# Patient Record
Sex: Male | Born: 1975 | Race: White | Hispanic: No | Smoking: Never smoker
Health system: Southern US, Academic
[De-identification: ages and names within clinical notes are randomized; demographics above are authoritative.]

## PROBLEM LIST (undated history)

## (undated) DIAGNOSIS — F419 Anxiety disorder, unspecified: Secondary | ICD-10-CM

## (undated) DIAGNOSIS — J309 Allergic rhinitis, unspecified: Secondary | ICD-10-CM

## (undated) DIAGNOSIS — H9209 Otalgia, unspecified ear: Secondary | ICD-10-CM

## (undated) DIAGNOSIS — I10 Essential (primary) hypertension: Secondary | ICD-10-CM

## (undated) HISTORY — DX: Anxiety disorder, unspecified: F41.9

## (undated) HISTORY — DX: Allergic rhinitis, unspecified: J30.9

## (undated) HISTORY — PX: HX NO SURGICAL PROCEDURES: 2100001501

## (undated) HISTORY — DX: Essential (primary) hypertension: I10

## (undated) HISTORY — DX: Otalgia, unspecified ear: H92.09

## (undated) HISTORY — PX: OTHER SURGICAL HISTORY: SHX170

---

## 2001-09-12 ENCOUNTER — Other Ambulatory Visit (HOSPITAL_COMMUNITY): Payer: Self-pay

## 2015-01-15 IMAGING — CR XRAY LUMBAR SPINE COMPLETE
1 series · 4 of 4 positions shown · non-contrast
Comparison: None.

Exam:   
Lumbar spine 4V
INDICATION: Back pain.

[Series 1: view not recorded · oblique · 0.17mm/px · 4 of 4 slices shown]
[im 1/4]
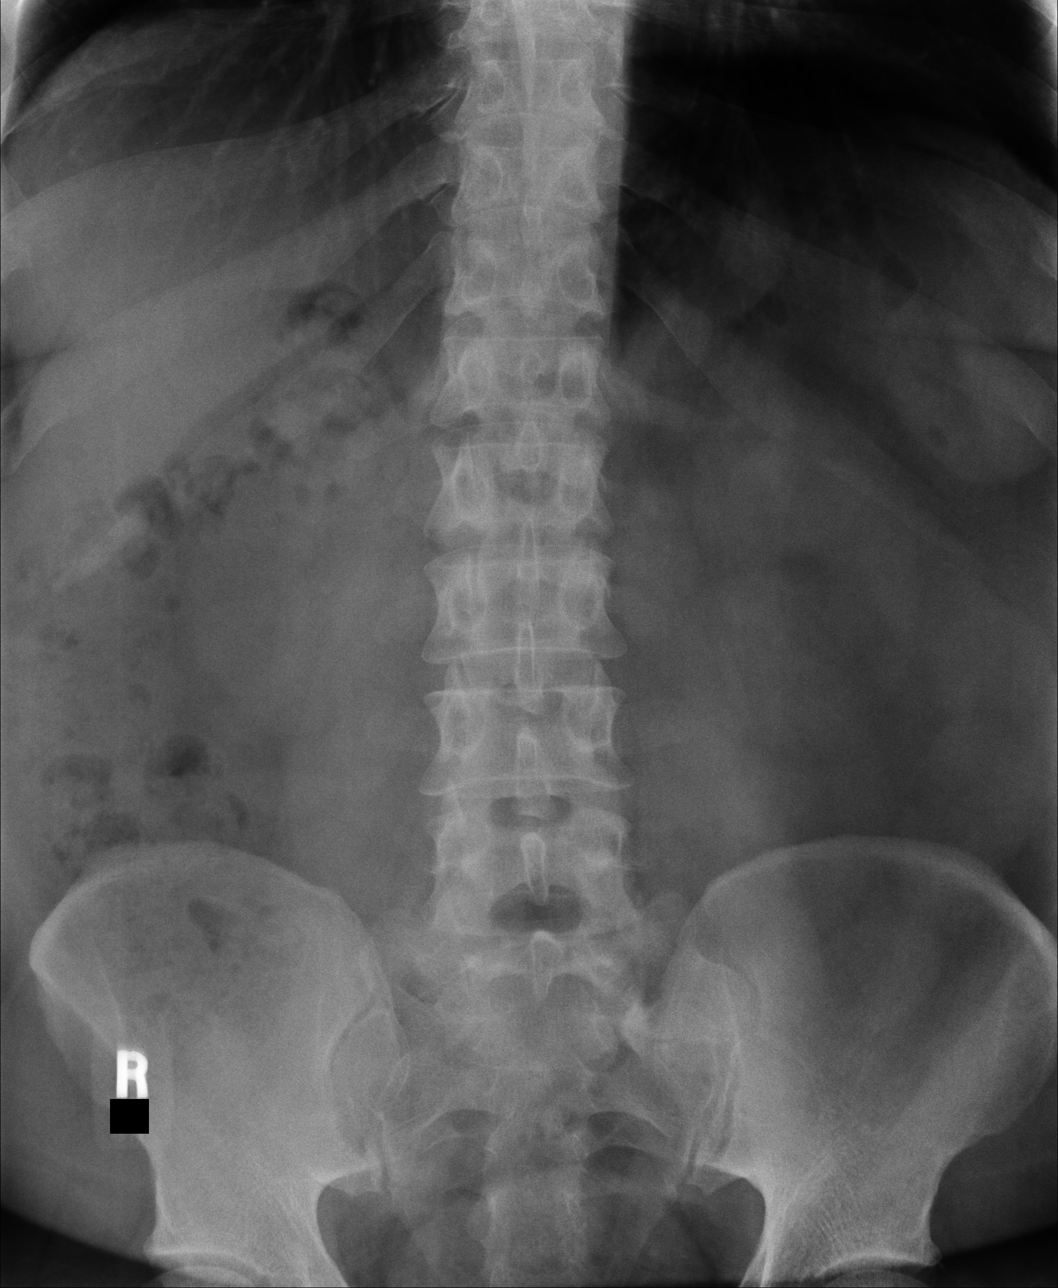
[im 2/4]
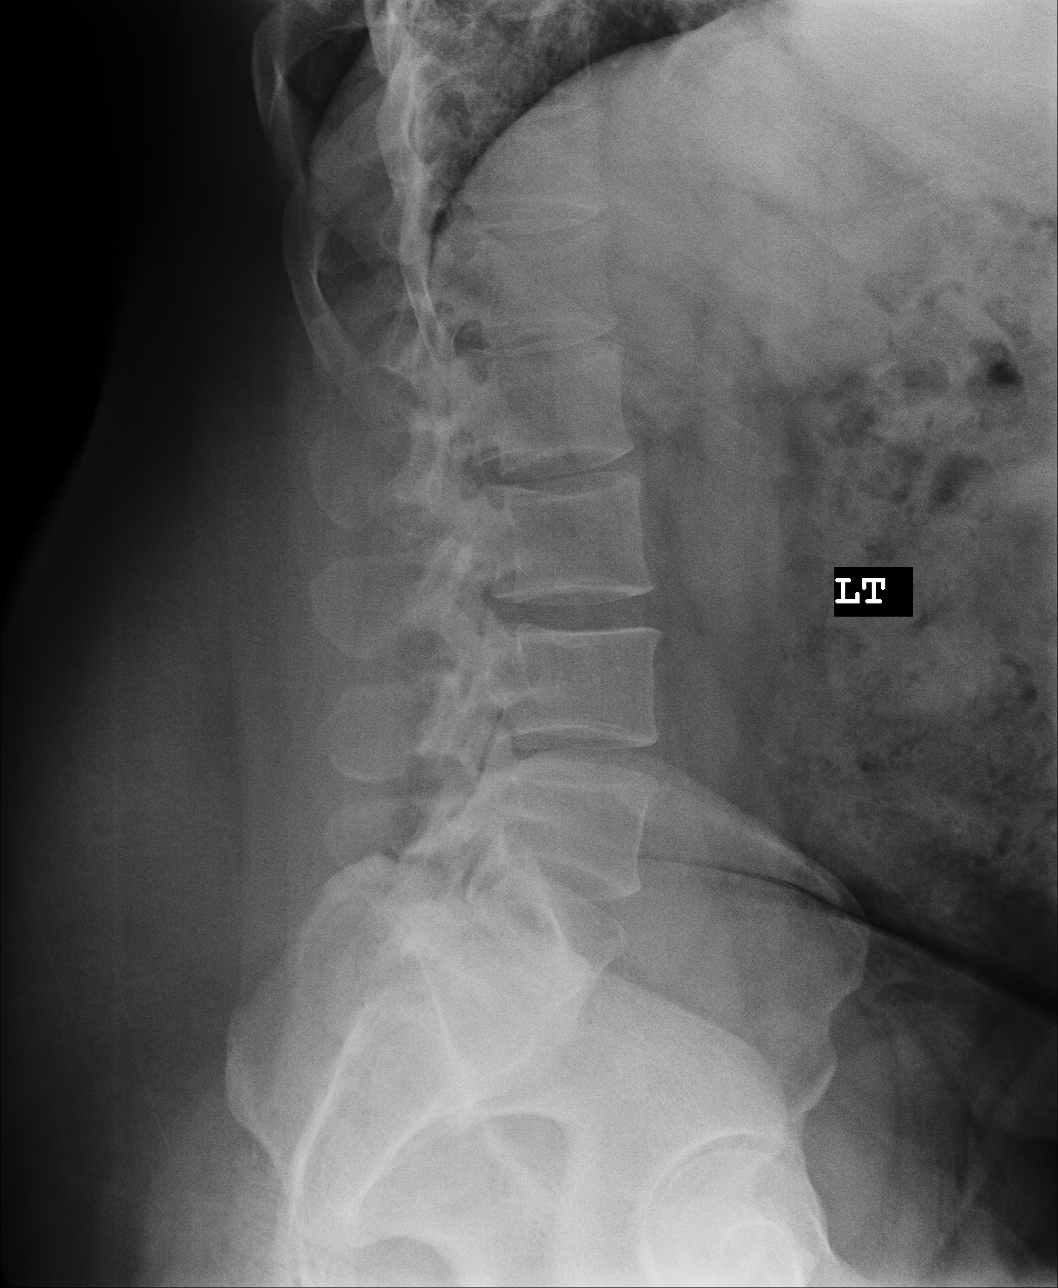
[im 3/4]
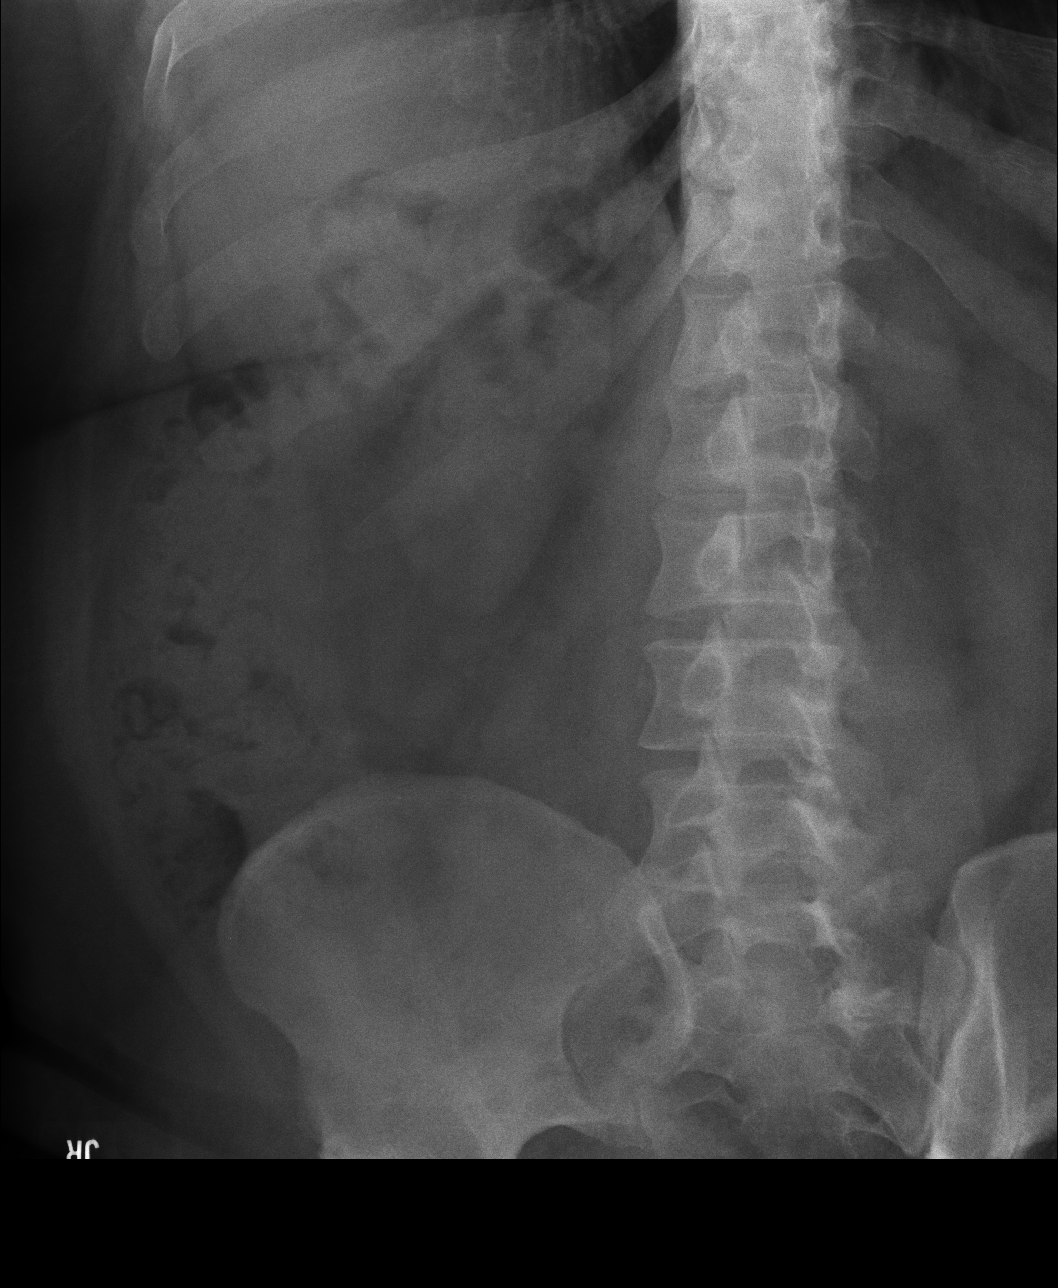
[im 4/4]
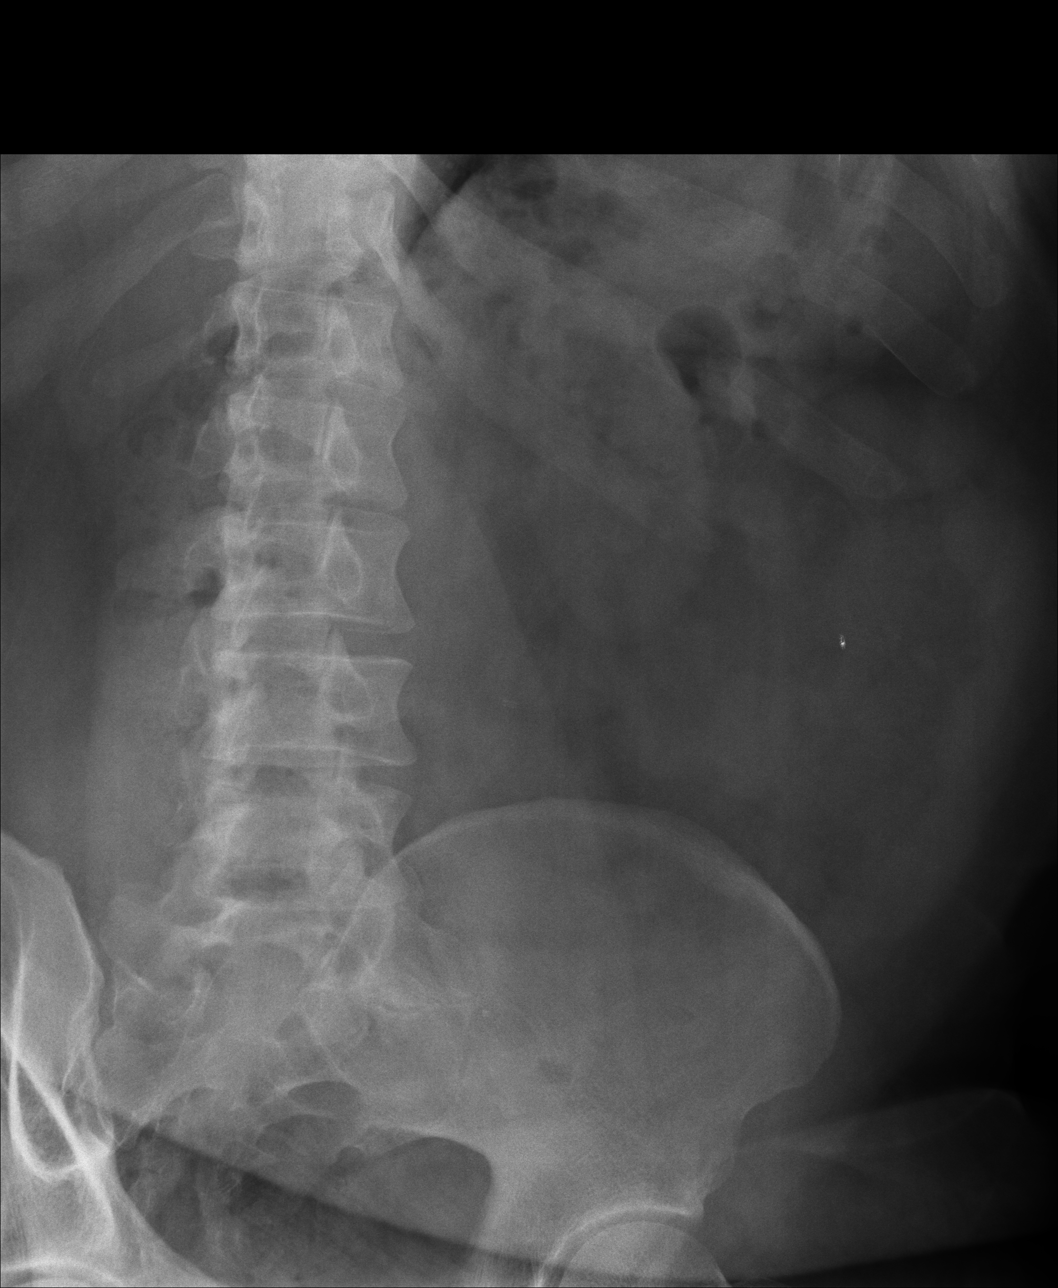

[4 of 4 positions shown; findings below may reference images not displayed]

FINDINGS: Lateral exam demonstrates normal height and alignment of the lumbar vertebral bodies. The disk spaces appear preserved. No pars defects are seen. No fractures are seen.
IMPRESSION: 1.
Negative lumbar spine.

## 2015-01-15 IMAGING — US VENOUS US UNILATERAL
1 series · 14 of 24 positions shown · non-contrast
Comparison: None.

Exam:   
Right lower extremity Doppler venous ultrasound
INDICATION: Rule out DVT.

[Series 1: venous us unilateral · oblique · 14 of 62 slices shown]
[im 1/62]
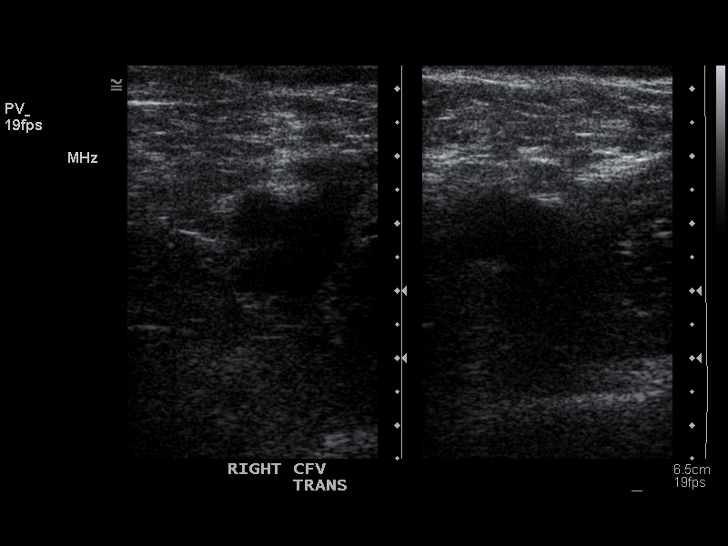
[im 6/62]
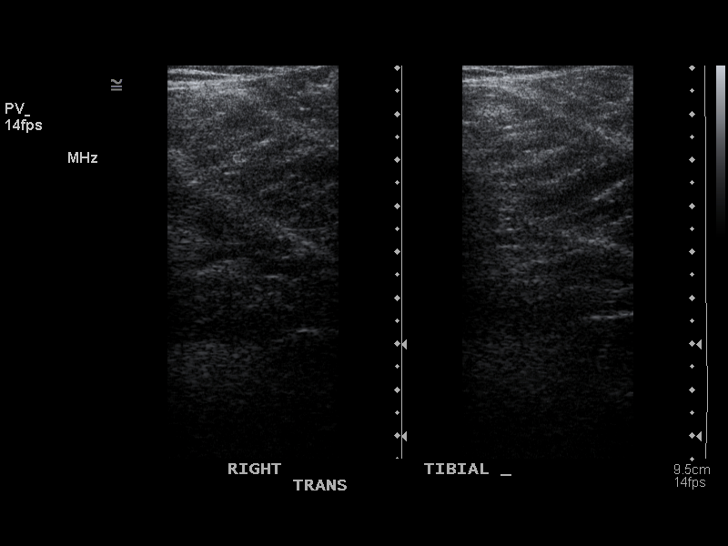
[im 11/62]
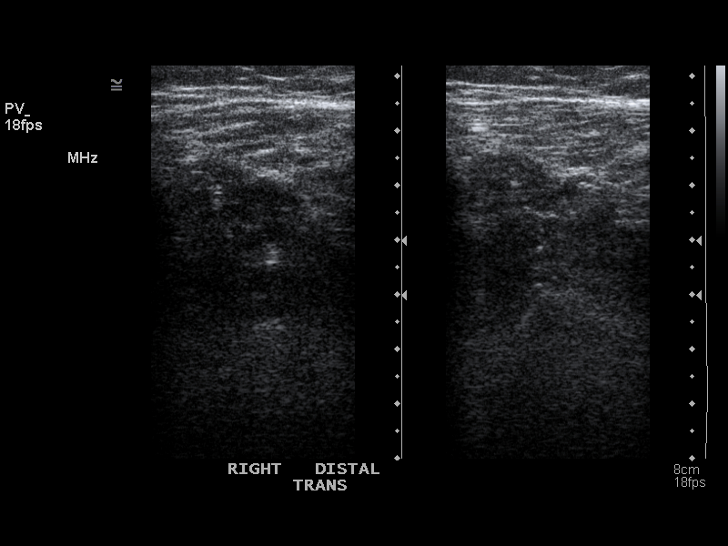
[im 16/62]
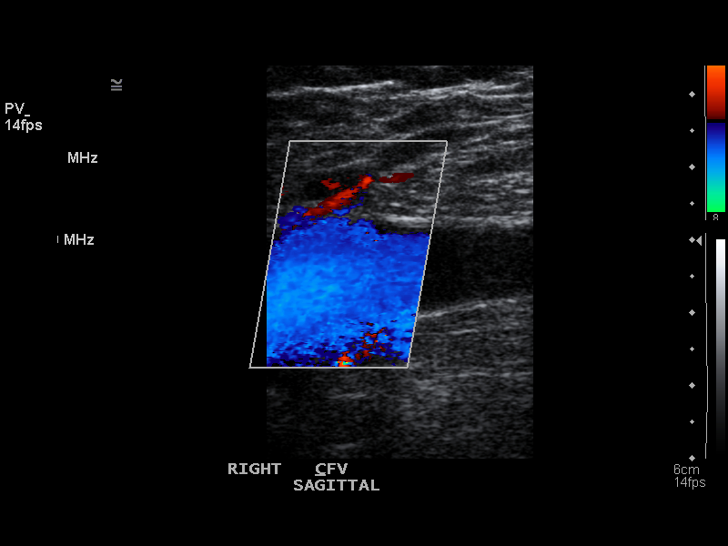
[im 19/62]
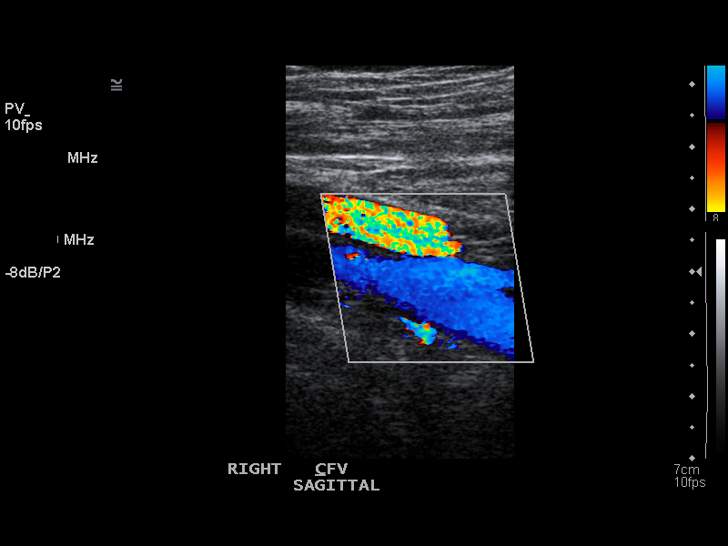
[im 24/62]
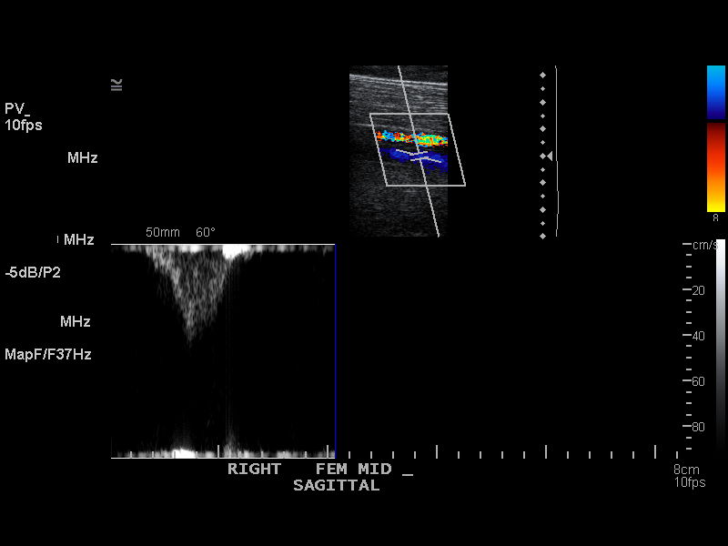
[im 30/62]
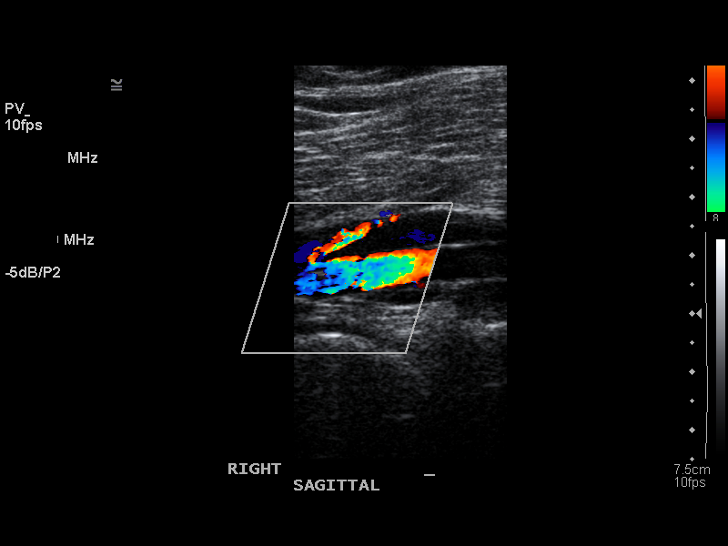
[im 32/62]
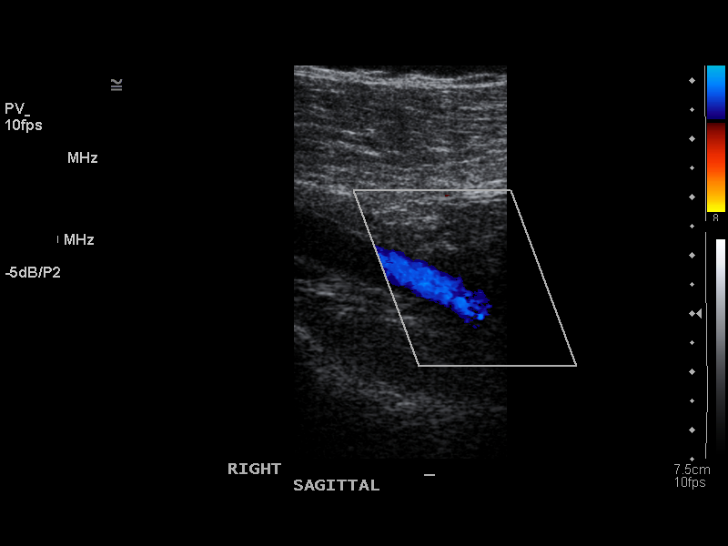
[im 38/62]
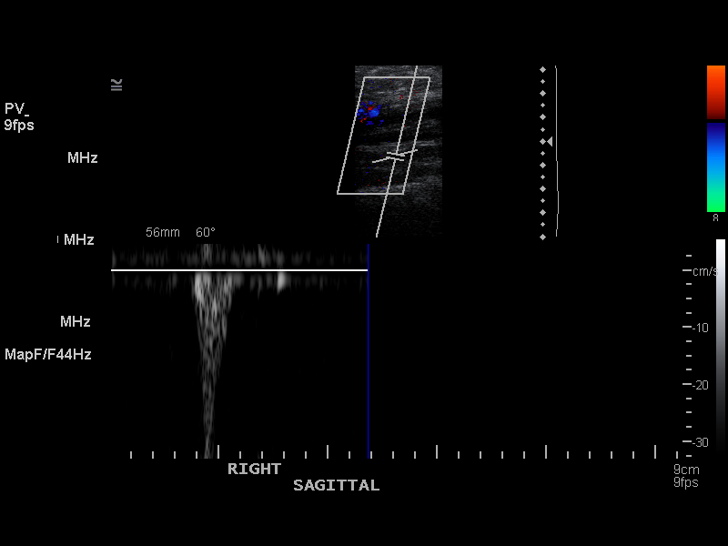
[im 43/62]
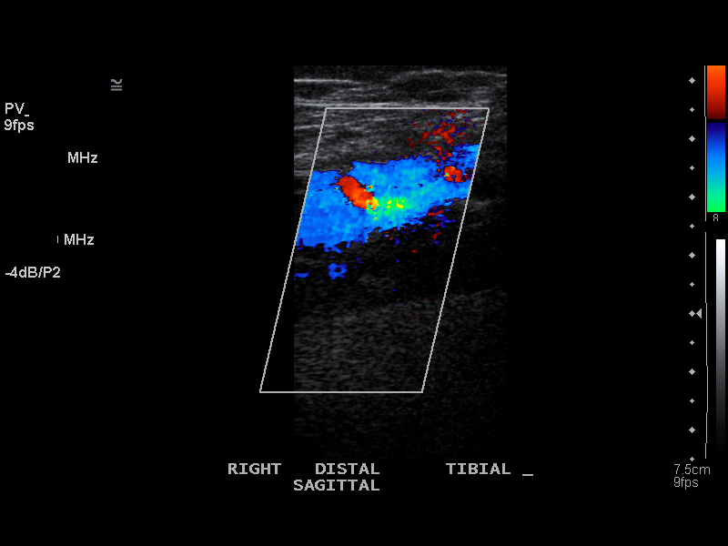
[im 48/62]
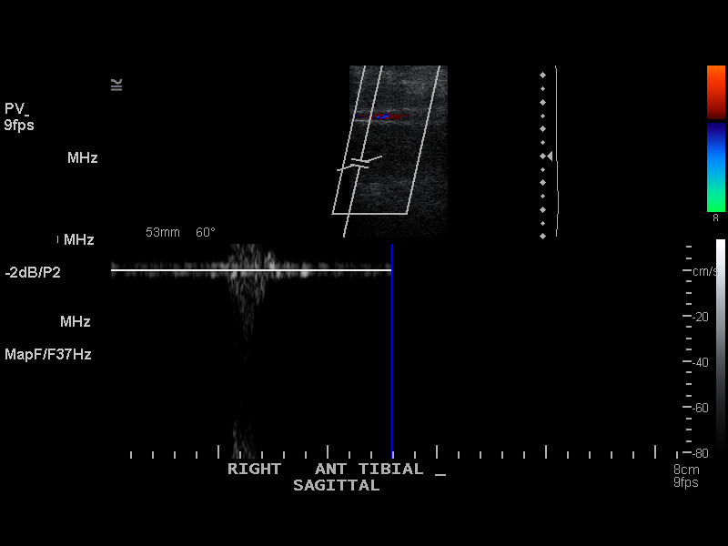
[im 51/62]
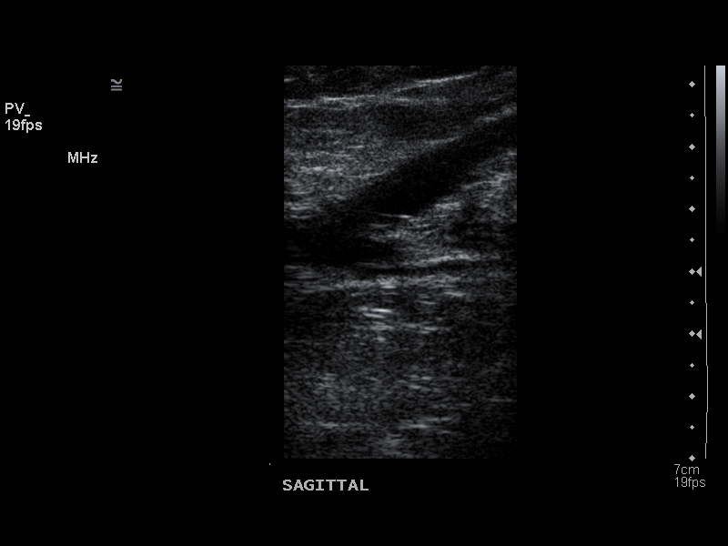
[im 56/62]
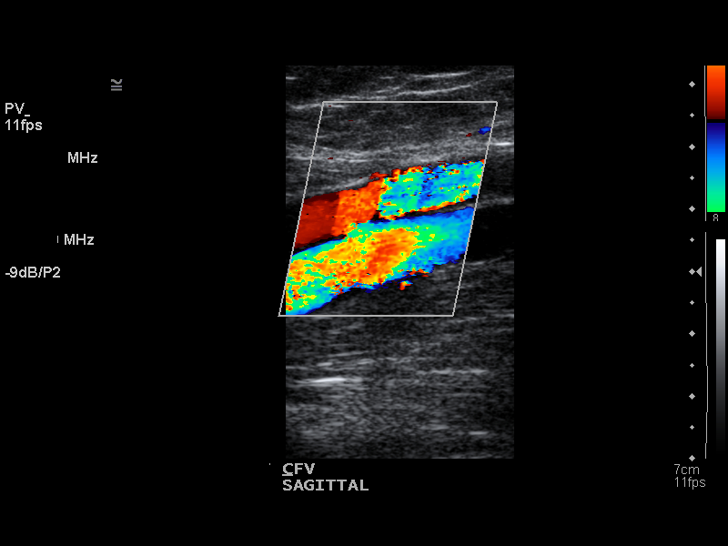
[im 62/62]
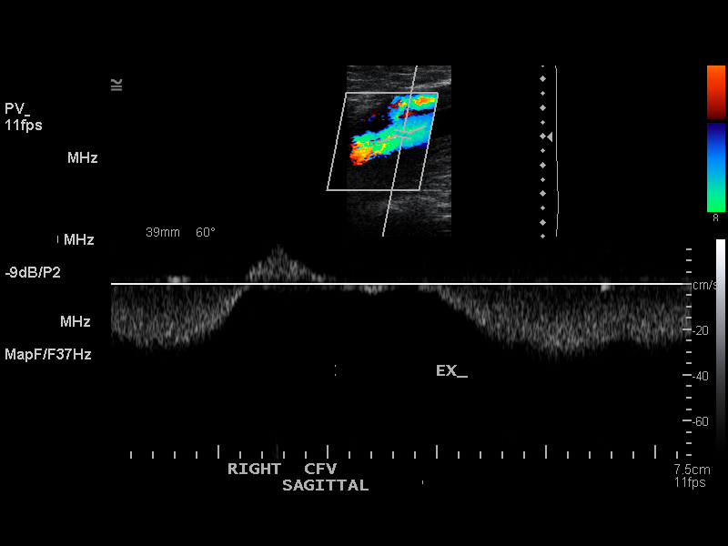

[14 of 24 positions shown; findings below may reference images not displayed]

FINDINGS: The common femoral, superficial femoral, popliteal veins of the right lower extremity demonstrate compressibility, color flow, and augmentation. The contra lateral common femoral vein demonstrates compressibility as well.
IMPRESSION: 1.
Negative right lower extremity Doppler venous ultrasound.

## 2015-01-15 IMAGING — CR XRAY FEMUR 2 OR MORE VIEWS
1 series · 4 of 4 positions shown · non-contrast
Comparison: None.

Exam:   
Right femur 2V
INDICATION: Pain.

[Series 1: view not recorded · oblique · 0.17mm/px · 4 of 4 slices shown]
[im 1/4]
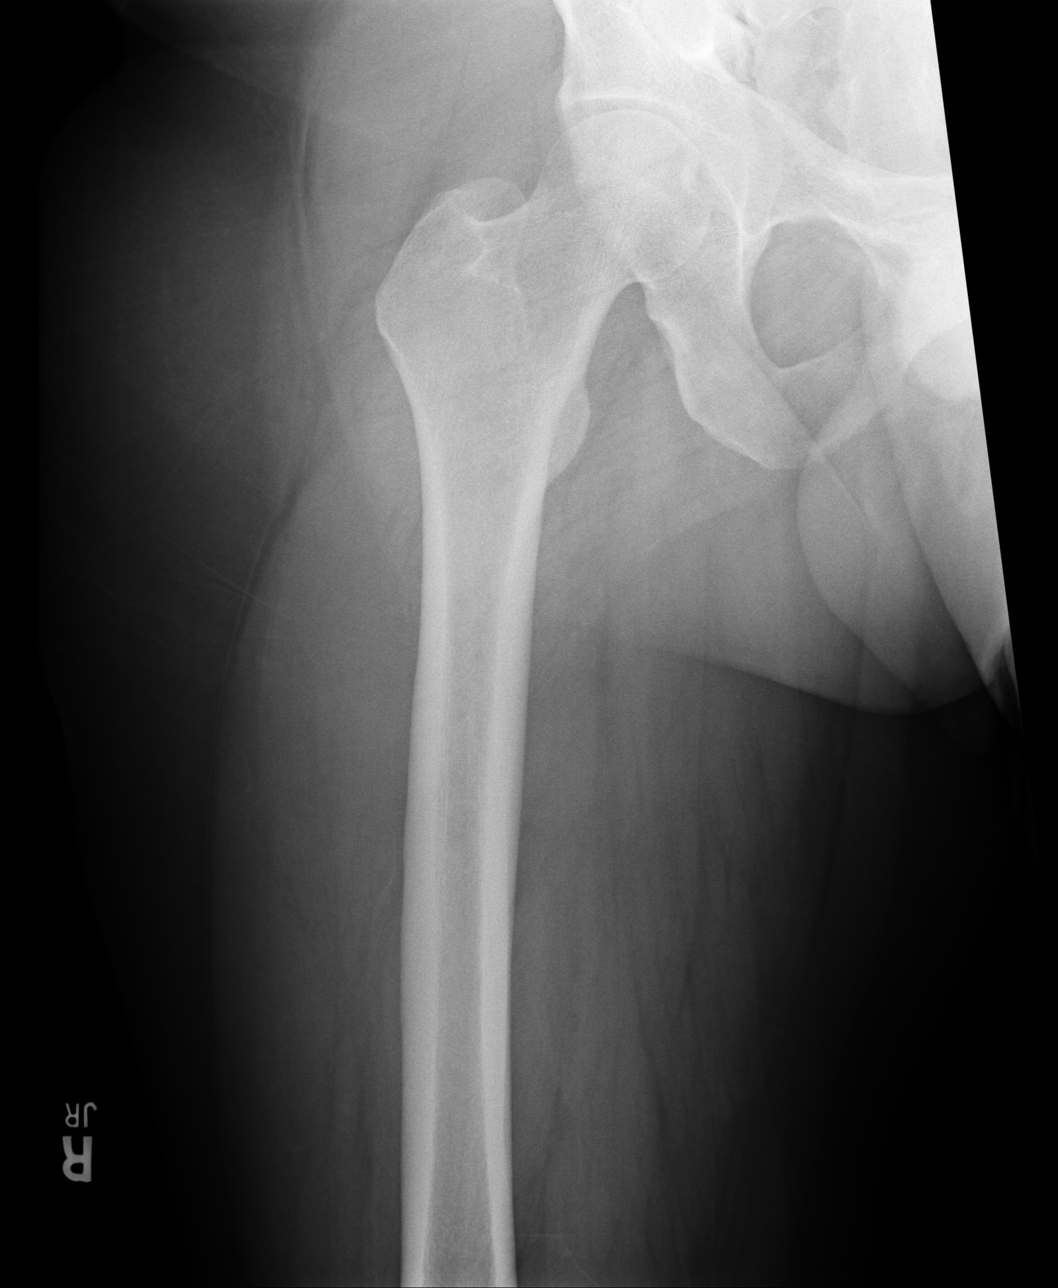
[im 2/4]
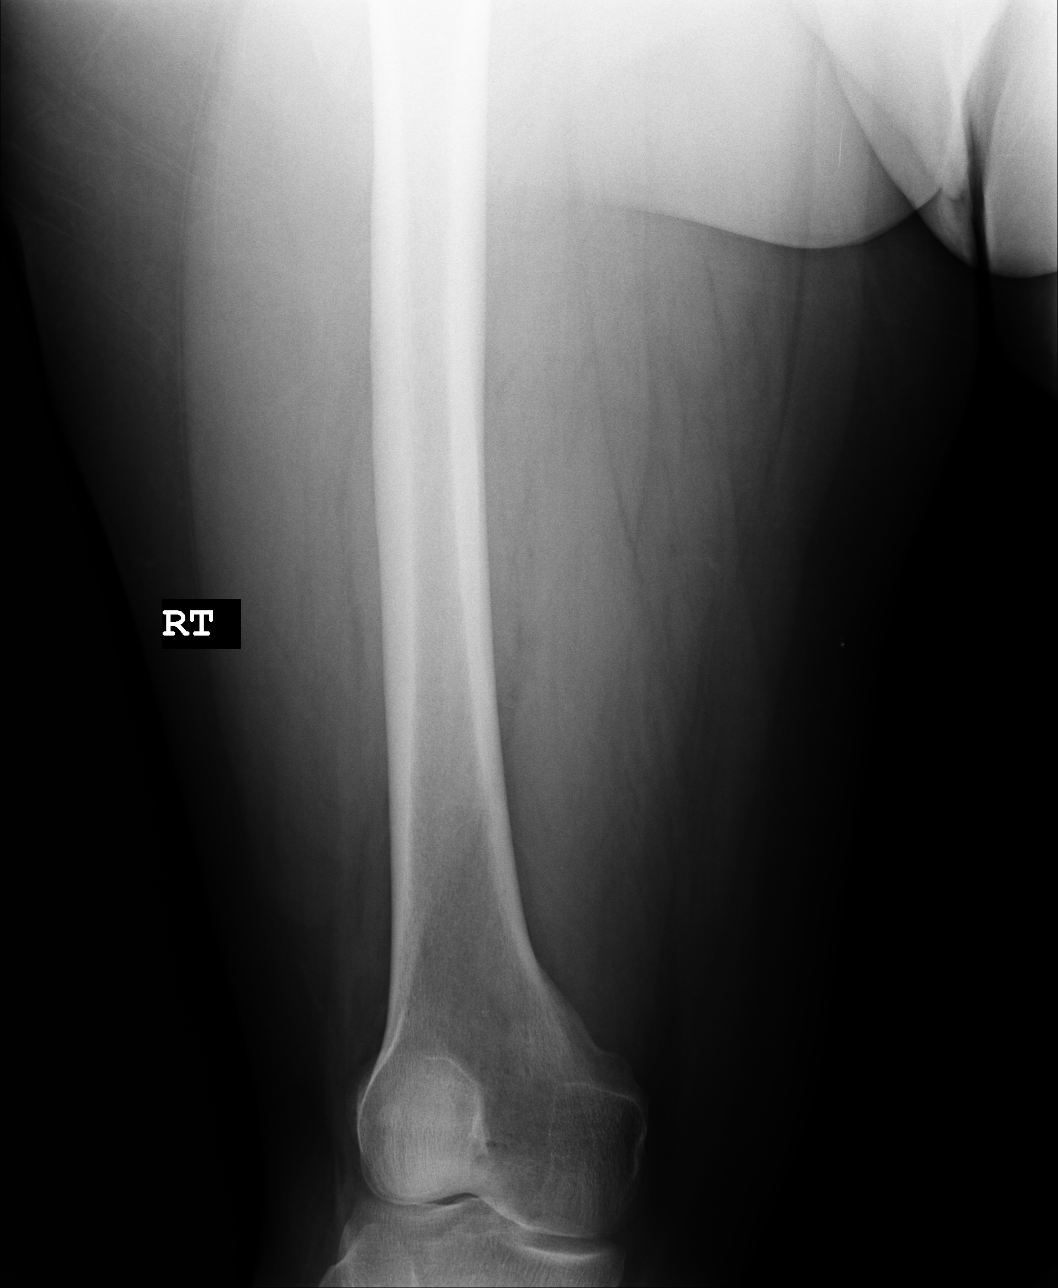
[im 3/4]
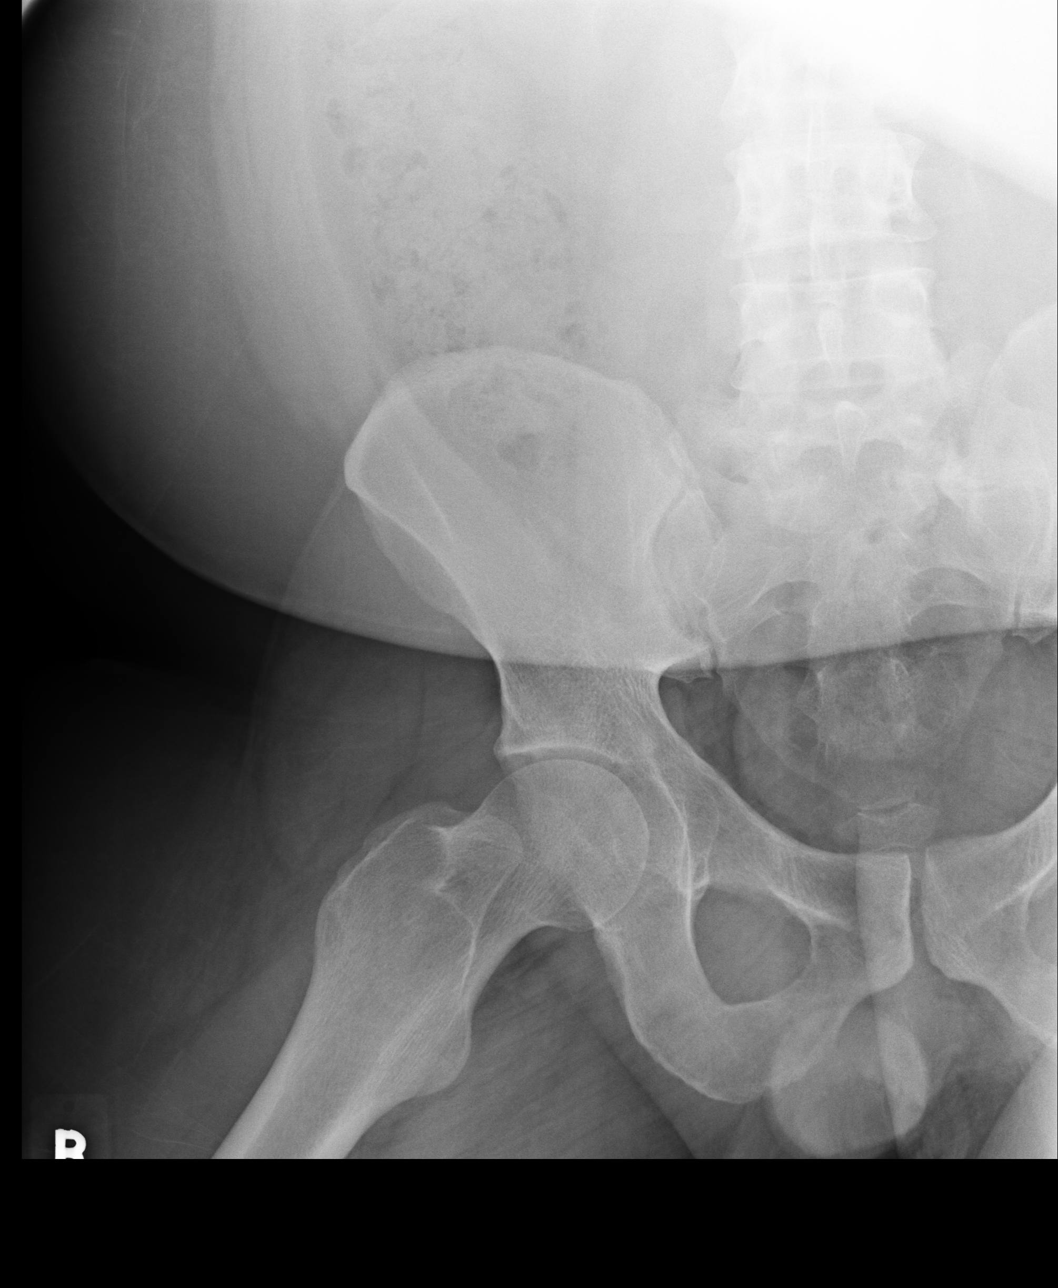
[im 4/4]
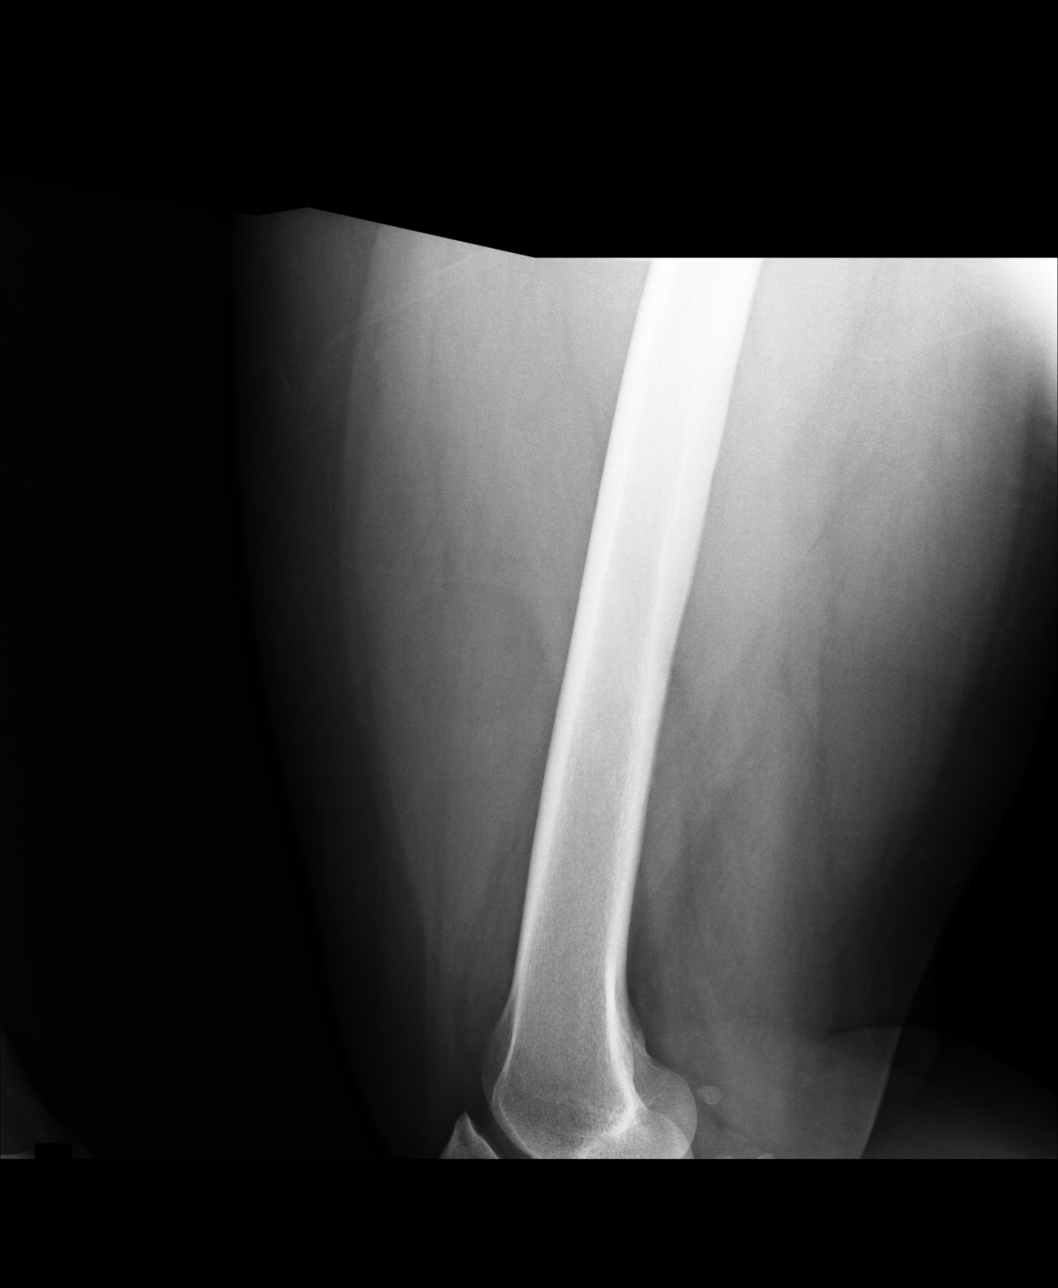

[4 of 4 positions shown; findings below may reference images not displayed]

FINDINGS: The joint spaces are preserved. No fractures are seen.
IMPRESSION: 1.
Negative right femur.

## 2019-12-15 IMAGING — US US ABDOMEN COMPLETE
1 series · 14 of 25 positions shown · non-contrast
Comparison: None.

EXAM:  US ABDOMEN COMPLETE
INDICATION: Right upper quadrant abdominal pain.

[Series 5: us abdomen complete · 0.43mm/px · 14 of 49 slices shown]
[im 1/49]
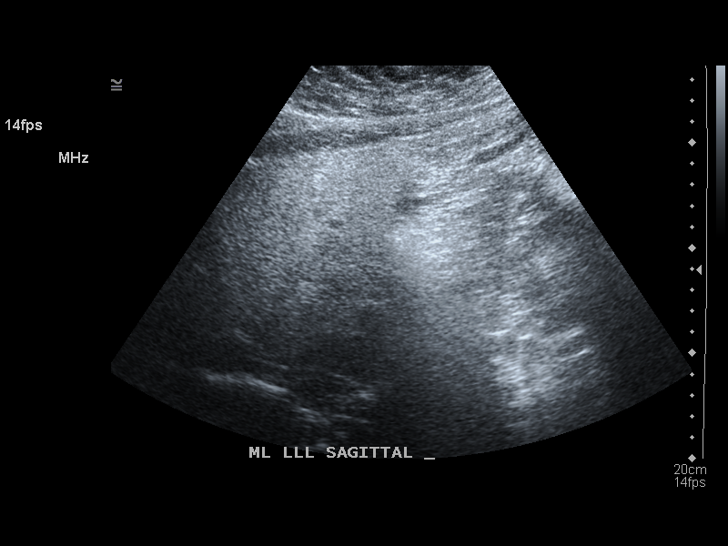
[im 5/49]
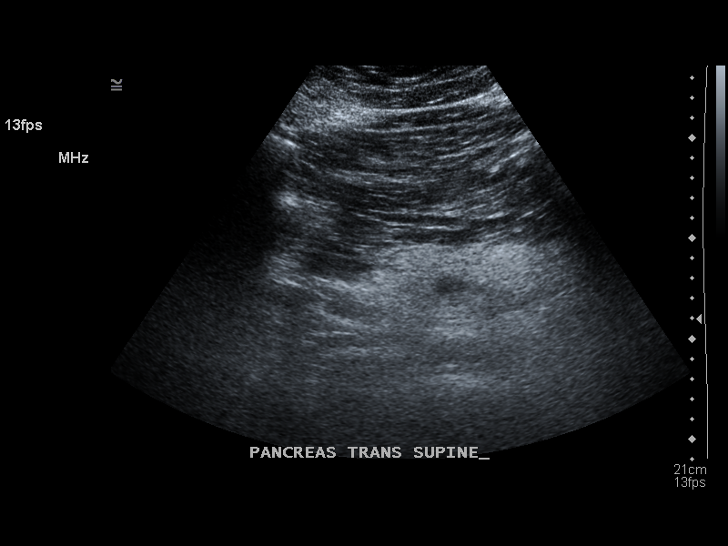
[im 9/49]
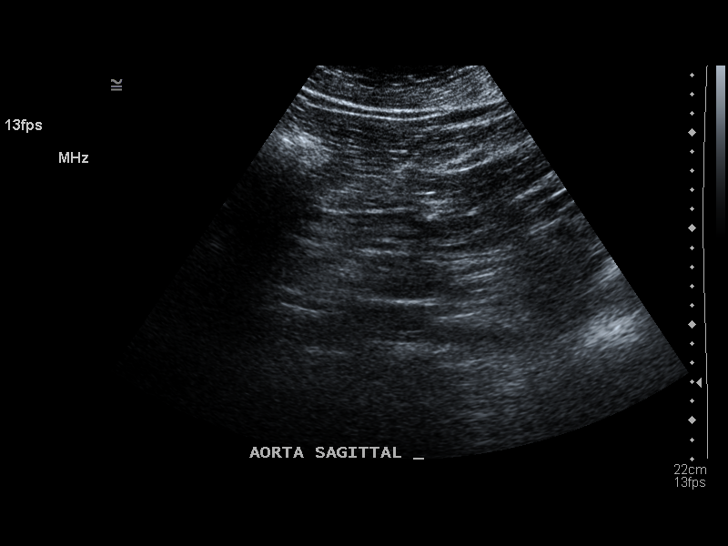
[im 13/49]
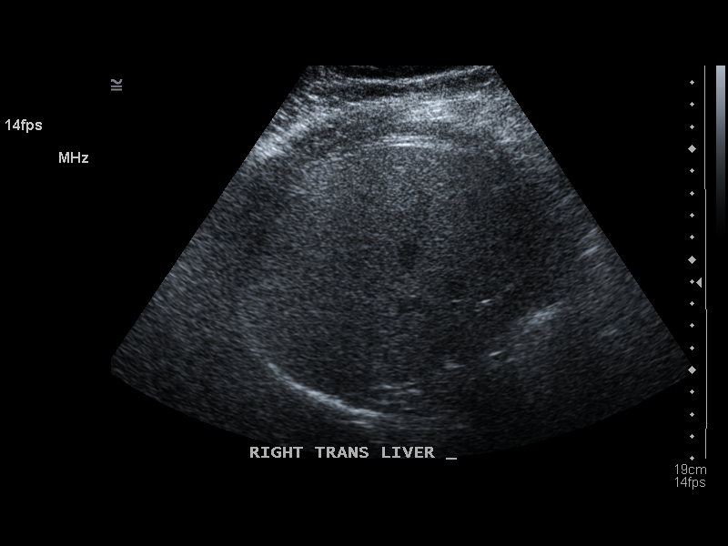
[im 17/49]
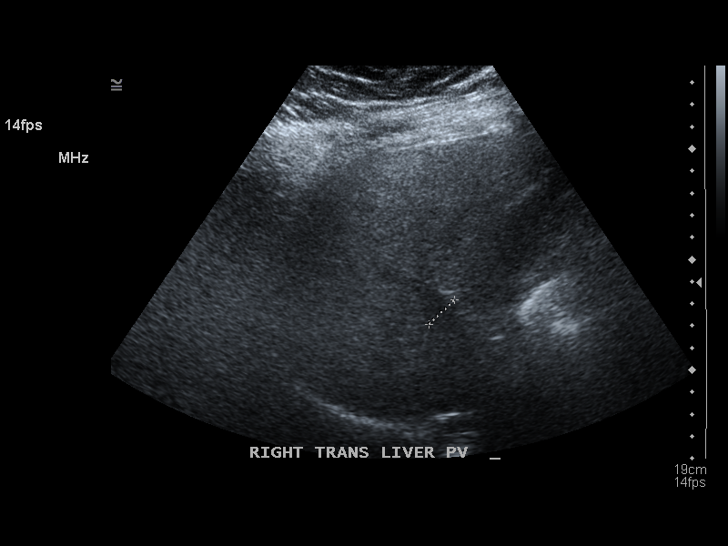
[im 19/49]
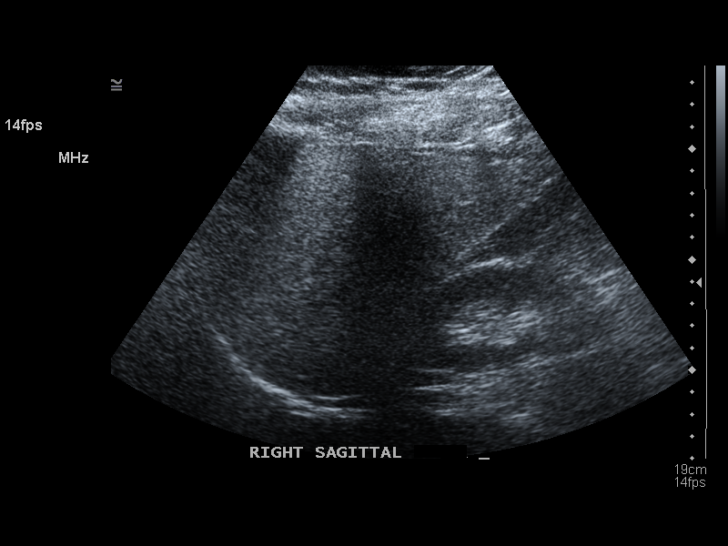
[im 23/49]
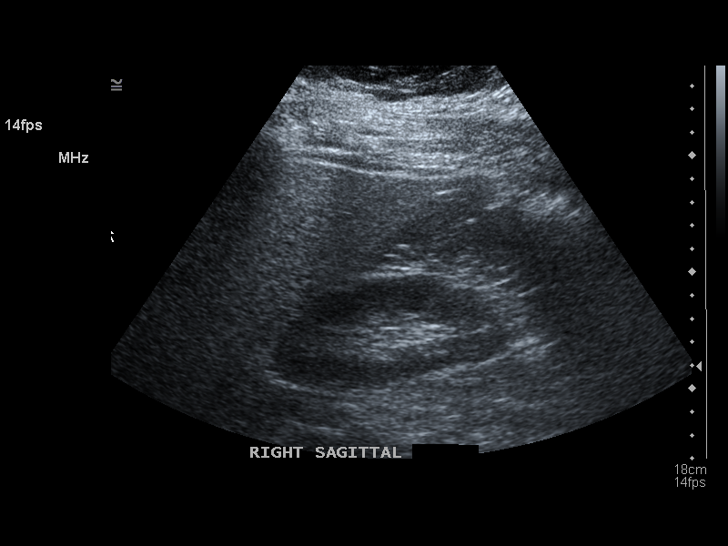
[im 27/49]
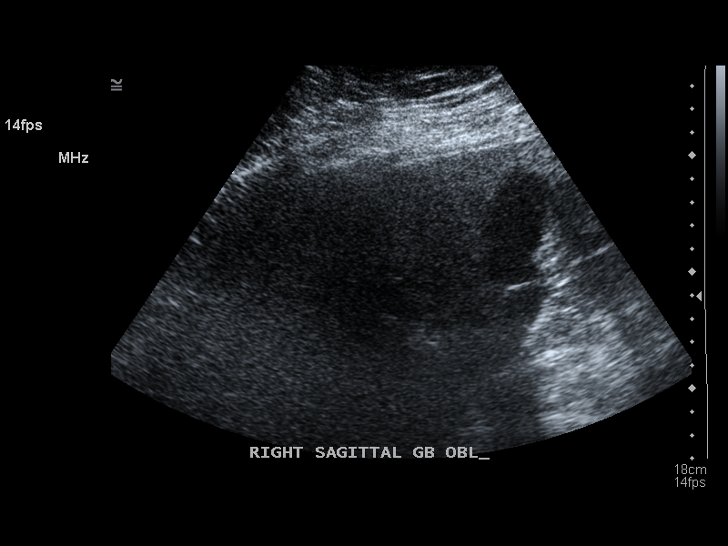
[im 31/49]
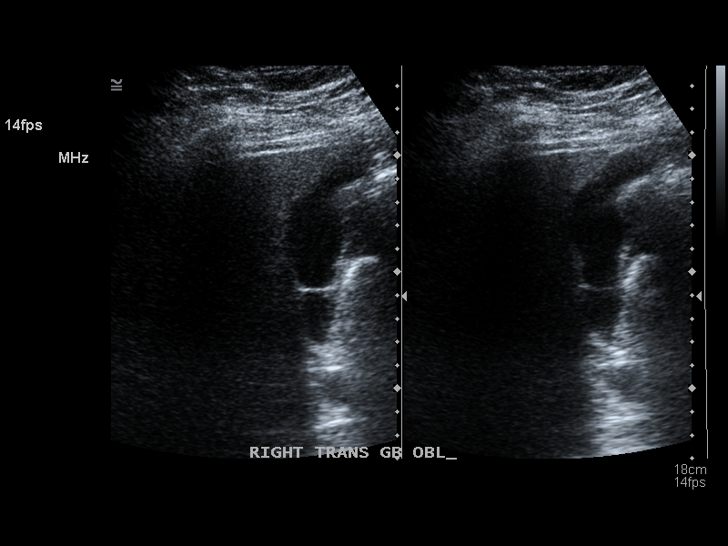
[im 33/49]
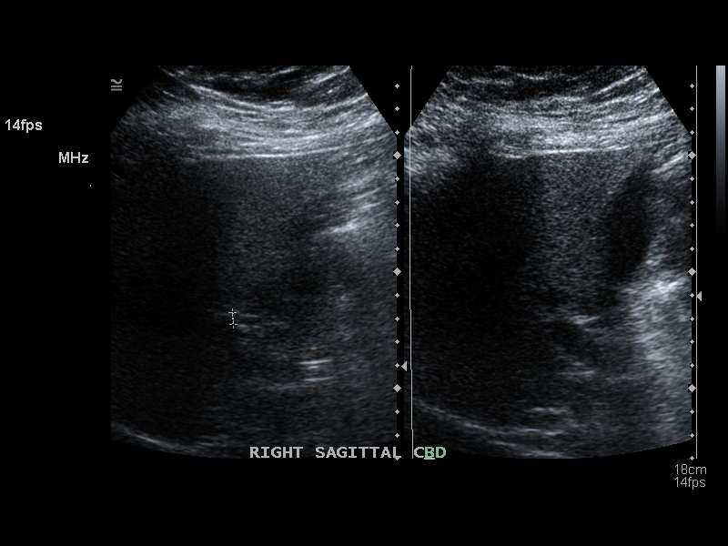
[im 37/49]
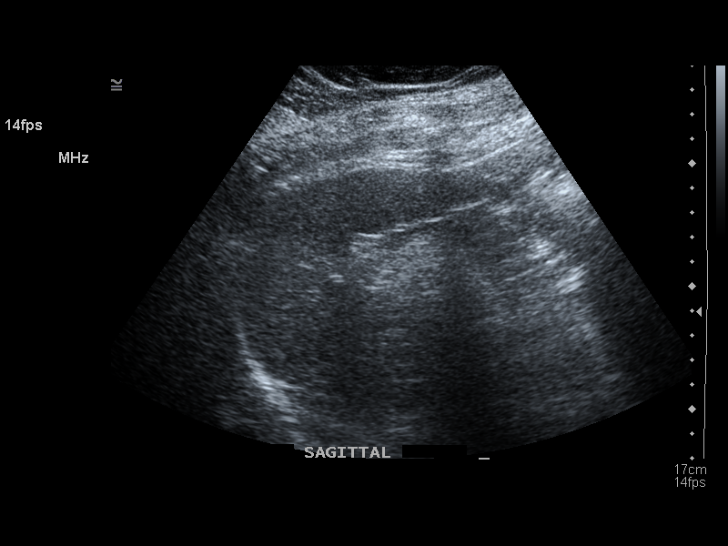
[im 41/49]
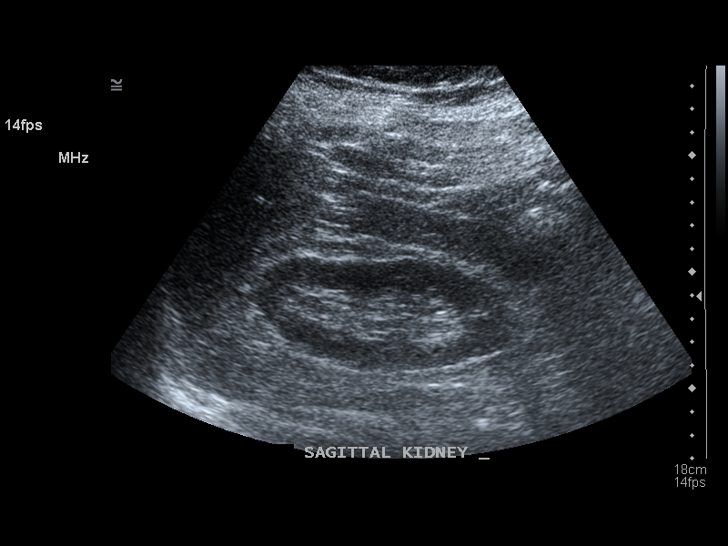
[im 45/49]
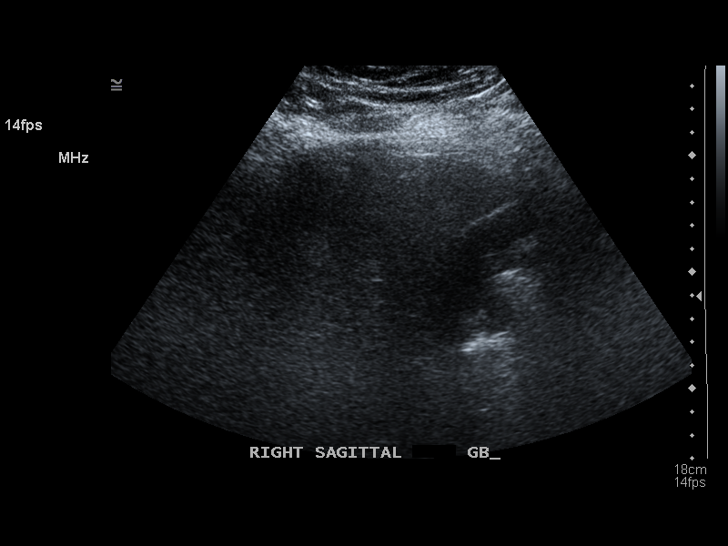
[im 49/49]
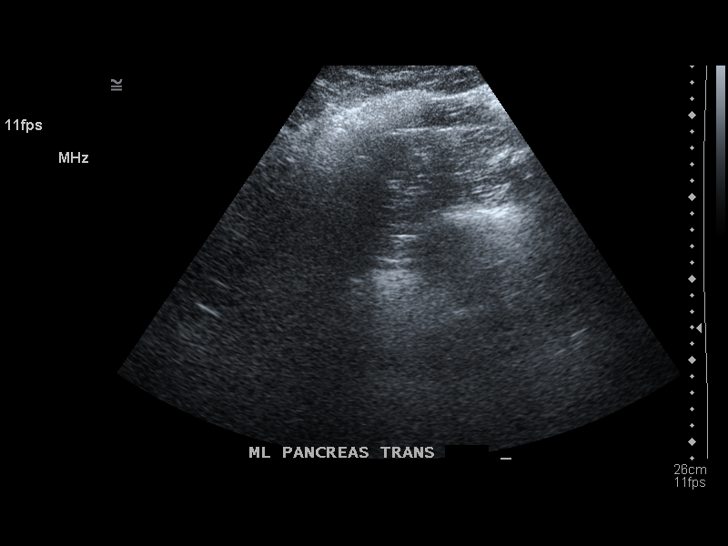

[14 of 25 positions shown; findings below may reference images not displayed]

FINDINGS: Liver is echogenic compatible with fatty infiltration.  Fatty infiltration limits evaluation for a focal hepatic mass.  There is no intra or extrahepatic biliary ductal dilatation. Common bile duct measures 5 mm. No sludge or shadowing gallstones are seen. There is no gallbladder wall thickening or pericholecystic fluid. Pancreas is incompletely visualized due to artifact from overlying bowel gas. Spleen measures 12 cm and is normal.

Kidneys are normal in echogenicity.  Right kidney measures 10.5 cm and left kidney measures 12 cm.  There is no hydronephrosis, mass or cyst on either side.

Abdominal aorta is also not well seen due to bowel gas.  IVC is normal. There is no ascites.
IMPRESSION: Fatty liver.  

No evidence of cholelithiasis or acute cholecystitis. 

Pancreas and abdominal aorta are not well seen due to artifact from overlying bowel gas.

## 2022-01-30 ENCOUNTER — Encounter (RURAL_HEALTH_CENTER): Payer: Self-pay | Admitting: Physician Assistant

## 2022-01-30 DIAGNOSIS — H9209 Otalgia, unspecified ear: Secondary | ICD-10-CM

## 2022-02-12 ENCOUNTER — Ambulatory Visit (RURAL_HEALTH_CENTER): Payer: BC Managed Care – PPO | Admitting: Physician Assistant

## 2022-03-19 ENCOUNTER — Other Ambulatory Visit: Payer: Self-pay

## 2022-03-19 ENCOUNTER — Ambulatory Visit (RURAL_HEALTH_CENTER): Payer: BC Managed Care – PPO | Attending: Physician Assistant | Admitting: Physician Assistant

## 2022-03-19 ENCOUNTER — Encounter (RURAL_HEALTH_CENTER): Payer: Self-pay | Admitting: Physician Assistant

## 2022-03-19 DIAGNOSIS — F419 Anxiety disorder, unspecified: Secondary | ICD-10-CM | POA: Insufficient documentation

## 2022-03-19 DIAGNOSIS — Z0130 Encounter for examination of blood pressure without abnormal findings: Secondary | ICD-10-CM | POA: Insufficient documentation

## 2022-03-19 DIAGNOSIS — R635 Abnormal weight gain: Secondary | ICD-10-CM

## 2022-03-19 DIAGNOSIS — E559 Vitamin D deficiency, unspecified: Secondary | ICD-10-CM | POA: Insufficient documentation

## 2022-03-19 DIAGNOSIS — E669 Obesity, unspecified: Secondary | ICD-10-CM | POA: Insufficient documentation

## 2022-03-19 DIAGNOSIS — I10 Essential (primary) hypertension: Secondary | ICD-10-CM | POA: Insufficient documentation

## 2022-03-19 DIAGNOSIS — J309 Allergic rhinitis, unspecified: Secondary | ICD-10-CM | POA: Insufficient documentation

## 2022-03-19 MED ORDER — WEGOVY 0.25 MG/0.5 ML SUBCUTANEOUS PEN INJECTOR
0.2500 mg | PEN_INJECTOR | SUBCUTANEOUS | 1 refills | Status: DC
Start: 2022-03-19 — End: 2022-04-24

## 2022-03-19 NOTE — Nursing Note (Signed)
Patient is doing tele med for routine follow up.

## 2022-03-19 NOTE — Progress Notes (Signed)
INTERNAL MEDICINE, [place]  [address] [address]    Telephone Visit    Name:  Wayne Moody. MRN: [id number]   Date:  03/19/2022 Age:   46 y.o.     The patient/family initiated a request for telephone service.  Verbal consent for this service was obtained from the patient/family.    TELEMEDICINE DOCUMENTATION:    [facility]  Patient/family aware of provider location:  yes  Patient/family consent for telemedicine:  yes  Examination observed and performed by:  Cleophus Molt, PA-C      Reason for call: f/u med refills; discuss wt loss    Call notes:  Pt complaining of  Weight gain with noted obesity for which patient has been on Adipex off and on for some time. Pt has  tried other agents such as Saxenda without success or issues with insurance. Pt states daughter started on wegovy and has already lost 7 lbs in couple weeks so would like to try if poss. Current wt 224.    F/U anxiety w pt using valium prn. At this time states anxiety is stable and he is doing well. Denies any depression issues. Does not need refill of med today.    Follow-up vitamin D deficiency for which patient is taking  over-the-counter multivitamin with vitamin D. Overall feels good and  denies any arthralgias/joint pain increased fatigue.      Current Meds:  . diazePAM (VALIUM) 5 mg Oral Tablet Take 1 Tablet (5 mg total) by mouth Every 6 hours as needed for Anxiety   . montelukast (SINGULAIR) 10 mg Oral Tablet Take 1 Tablet (10 mg total) by mouth Every evening   . semaglutide, weight loss, (WEGOVY) 0.25 mg/0.5 mL Subcutaneous Pen Injector Inject 0.5 mL (0.25 mg total) under the skin Every 7 days for 30 days        Allergies   Allergen Reactions   . Shellfish Derived Swelling        Past Medical History:   Diagnosis Date   . Allergic rhinitis    . Anxiety    . Ear pain    . Hypertension         Social History     Socioeconomic History   . Marital status:  Unknown   Tobacco Use   . Smoking status: Never   . Smokeless tobacco: Never   Substance and Sexual Activity   . Alcohol use: Yes     Comment: Holidays   . Drug use: Never      Past Surgical History:   Procedure Laterality Date   . OTHER SURGICAL HISTORY      urology procedure     Family Medical History:     Problem Relation (Age of Onset)    Cancer Mother        Physical Exam:    Physical Exam  Constitutional:       General: He is not in acute distress.  Pulmonary:      Effort: Pulmonary effort is normal.      Comments: Pt able to speak in complete sentences    Neurological:      Mental Status: He is alert.   Psychiatric:         Mood and Affect: Mood normal.         Behavior: Behavior normal.         Thought Content: Thought content normal.  Judgment: Judgment normal.           ICD-10-CM    1. Weight gain  R63.5       2. Morbid obesity (CMS HCC)  E66.01       3. Anxiety  F41.9       4. Vitamin D deficiency  E55.9           Plan    Discussed diet exercise wt loss   wegovy 0.25mg  weekly  Continue vit d daily  Continue valium prn  F/u 3 months    Total provider time spent with the patient on the phone: 18 minutes.    Post-Discharge Follow Up Appointments     Thursday Jun 18, 2022    Return Patient Visit with Cleophus Molt, PA-C at  8:30 AM    Internal Medicine, [facility]  [facility] Internal Medicine [facility], [place] [address]  [place], PA-C

## 2022-04-24 ENCOUNTER — Other Ambulatory Visit (INDEPENDENT_AMBULATORY_CARE_PROVIDER_SITE_OTHER): Payer: Self-pay | Admitting: Physician Assistant

## 2022-04-24 MED ORDER — WEGOVY 0.25 MG/0.5 ML SUBCUTANEOUS PEN INJECTOR
0.2500 mg | PEN_INJECTOR | SUBCUTANEOUS | 1 refills | Status: DC
Start: 2022-04-24 — End: 2022-05-28

## 2022-05-28 ENCOUNTER — Telehealth (INDEPENDENT_AMBULATORY_CARE_PROVIDER_SITE_OTHER): Payer: Self-pay | Admitting: Physician Assistant

## 2022-05-28 MED ORDER — WEGOVY 0.25 MG/0.5 ML SUBCUTANEOUS PEN INJECTOR
0.5000 mg | PEN_INJECTOR | SUBCUTANEOUS | 1 refills | Status: DC
Start: 2022-05-28 — End: 2022-05-28

## 2022-05-28 MED ORDER — WEGOVY 0.25 MG/0.5 ML SUBCUTANEOUS PEN INJECTOR
0.5000 mg | PEN_INJECTOR | SUBCUTANEOUS | 1 refills | Status: DC
Start: 2022-05-28 — End: 2022-05-29

## 2022-05-28 NOTE — Telephone Encounter (Addendum)
Amy gave order to increase to 0.5mg  weekly        Patient called wanting Wegovy increased.

## 2022-05-29 ENCOUNTER — Other Ambulatory Visit (INDEPENDENT_AMBULATORY_CARE_PROVIDER_SITE_OTHER): Payer: Self-pay | Admitting: Physician Assistant

## 2022-05-29 MED ORDER — WEGOVY 0.5 MG/0.5 ML SUBCUTANEOUS PEN INJECTOR
0.5000 mg | PEN_INJECTOR | SUBCUTANEOUS | 2 refills | Status: DC
Start: 2022-05-29 — End: 2022-06-24

## 2022-06-18 ENCOUNTER — Ambulatory Visit (INDEPENDENT_AMBULATORY_CARE_PROVIDER_SITE_OTHER): Payer: BC Managed Care – PPO | Admitting: Physician Assistant

## 2022-06-24 ENCOUNTER — Other Ambulatory Visit (INDEPENDENT_AMBULATORY_CARE_PROVIDER_SITE_OTHER): Payer: Self-pay | Admitting: Physician Assistant

## 2022-06-24 MED ORDER — WEGOVY 0.5 MG/0.5 ML SUBCUTANEOUS PEN INJECTOR
0.5000 mg | PEN_INJECTOR | SUBCUTANEOUS | 2 refills | Status: DC
Start: 2022-06-24 — End: 2022-06-30

## 2022-06-30 ENCOUNTER — Other Ambulatory Visit (INDEPENDENT_AMBULATORY_CARE_PROVIDER_SITE_OTHER): Payer: Self-pay | Admitting: Physician Assistant

## 2022-06-30 MED ORDER — WEGOVY 1 MG/0.5 ML SUBCUTANEOUS PEN INJECTOR
1.0000 mg | PEN_INJECTOR | SUBCUTANEOUS | 3 refills | Status: DC
Start: 2022-06-30 — End: 2022-07-06

## 2022-07-06 ENCOUNTER — Other Ambulatory Visit: Payer: Self-pay

## 2022-07-06 ENCOUNTER — Ambulatory Visit: Payer: BC Managed Care – PPO | Attending: Physician Assistant | Admitting: Physician Assistant

## 2022-07-06 ENCOUNTER — Encounter (INDEPENDENT_AMBULATORY_CARE_PROVIDER_SITE_OTHER): Payer: Self-pay | Admitting: Physician Assistant

## 2022-07-06 VITALS — BP 124/94 | HR 86 | Temp 97.0°F | Ht 67.0 in | Wt 318.0 lb

## 2022-07-06 DIAGNOSIS — E559 Vitamin D deficiency, unspecified: Secondary | ICD-10-CM | POA: Insufficient documentation

## 2022-07-06 DIAGNOSIS — Z23 Encounter for immunization: Secondary | ICD-10-CM | POA: Insufficient documentation

## 2022-07-06 DIAGNOSIS — E785 Hyperlipidemia, unspecified: Secondary | ICD-10-CM | POA: Insufficient documentation

## 2022-07-06 DIAGNOSIS — R030 Elevated blood-pressure reading, without diagnosis of hypertension: Secondary | ICD-10-CM | POA: Insufficient documentation

## 2022-07-06 DIAGNOSIS — R7989 Other specified abnormal findings of blood chemistry: Secondary | ICD-10-CM | POA: Insufficient documentation

## 2022-07-06 DIAGNOSIS — F419 Anxiety disorder, unspecified: Secondary | ICD-10-CM | POA: Insufficient documentation

## 2022-07-06 DIAGNOSIS — Z125 Encounter for screening for malignant neoplasm of prostate: Secondary | ICD-10-CM | POA: Insufficient documentation

## 2022-07-06 DIAGNOSIS — R5383 Other fatigue: Secondary | ICD-10-CM | POA: Insufficient documentation

## 2022-07-06 MED ORDER — MONTELUKAST 10 MG TABLET
10.0000 mg | ORAL_TABLET | Freq: Every day | ORAL | 0 refills | Status: DC
Start: 2022-07-06 — End: 2022-12-25

## 2022-07-06 MED ORDER — WEGOVY 1 MG/0.5 ML SUBCUTANEOUS PEN INJECTOR
1.0000 mg | PEN_INJECTOR | SUBCUTANEOUS | 1 refills | Status: DC
Start: 2022-07-06 — End: 2022-10-05

## 2022-07-06 MED ORDER — TDVAX 2 LF UNIT-2 LF UNIT/0.5 ML INTRAMUSCULAR SUSPENSION
0.5000 mL | Freq: Once | INTRAMUSCULAR | 0 refills | Status: AC
Start: 2022-07-06 — End: 2022-07-06

## 2022-07-06 NOTE — Progress Notes (Signed)
INTERNAL MEDICINE, [facility]  [address]  [place] [address]          Follow Up        Reason for Visit: Follow Up med refills labs    History of Present Illness  Wayne Adelson. is a 46 y.o. male who is being seen today in office for  follow up today concerning BP check w hx of borderline readings noted in the past. Pt does not currently take BP meds and has been working on wt loss.  First BP today 142/82 with repeat 104/78 and a third 124/94. +hx of morbid obesity noted for which he was started on wegovy. Currently at 1mg  weekly dosing and states he can finally tell a diff w curbing his appetite. Overall wt loss has not been much however. Currently wt 318 w BMI 49.81.  Pt has  tried other agents such as Saxenda without success or issues with insurance.     F/u chol/lipids w hx of borderline elevation noted in the past. Pt is currently no on meds and once again working wt. Needs labs ordered at [facility] to check chol.    F/U anxiety w pt using valium prn. At this time states anxiety is stable and he is doing well. Denies any depression issues.     Follow-up vitamin D deficiency for which patient is taking  over-the-counter multivitamin with vitamin D. Overall feels good and  denies any arthralgias/joint pain increased fatigue.    +hx of low test noted w occ fatigue reported. Pt currently no on med supplement and overall feels "pretty good". Would like to check level again however.    PHQ Questionnaire  Little interest or pleasure in doing things.: Not at all  Feeling down, depressed, or hopeless: Not at all  PHQ 2 Total: 0      Past Medical History:   Diagnosis Date    Allergic rhinitis     Anxiety     Ear pain     Hypertension          Past Surgical History:   Procedure Laterality Date    OTHER SURGICAL HISTORY      urology procedure      Family Medical History:       Problem Relation (Age of Onset)    Cancer Mother            Social History     Tobacco Use    Smoking status: Never     Smokeless tobacco: Never   Substance Use Topics    Alcohol use: Yes     Comment: Holidays    Drug use: Never     Medication:  diazePAM (VALIUM) 5 mg Oral Tablet, Take 1 Tablet (5 mg total) by mouth Every 6 hours as needed for Anxiety  montelukast (SINGULAIR) 10 mg Oral Tablet, Take 1 tablet by mouth once daily  semaglutide, weight loss, (WEGOVY) 1 mg/0.5 mL Subcutaneous Pen Injector, Inject 0.5 mL (1 mg total) under the skin Every 7 days    No facility-administered medications prior to visit.    Allergies:  Allergies   Allergen Reactions    Shellfish Derived Swelling       Physical Exam:  Vitals:    07/06/22 0838 07/06/22 0900 07/06/22 0946   BP: (!) 142/82 104/78 (!) 124/94   Pulse: 86     Temp: 36.1 C (97 F)     TempSrc: Tympanic     SpO2: 95%  Weight: (!) 144 kg (318 lb)     Height: 1.702 m (5\' 7" )     BMI: 49.91        Physical Exam  Vitals and nursing note reviewed.   Constitutional:       General: He is not in acute distress.     Appearance: Normal appearance. He is obese.   HENT:      Right Ear: Tympanic membrane normal.      Left Ear: Tympanic membrane normal.      Mouth/Throat:      Mouth: Mucous membranes are moist.      Pharynx: Oropharynx is clear.   Eyes:      Pupils: Pupils are equal, round, and reactive to light.   Cardiovascular:      Rate and Rhythm: Normal rate and regular rhythm.      Pulses: Normal pulses.      Heart sounds: Normal heart sounds.   Pulmonary:      Effort: Pulmonary effort is normal.      Breath sounds: Normal breath sounds.   Abdominal:      General: Bowel sounds are normal.      Palpations: Abdomen is soft.      Tenderness: There is no abdominal tenderness.      Comments: Obese abd   Musculoskeletal:         General: No swelling or tenderness. Normal range of motion.      Cervical back: Normal range of motion and neck supple.   Skin:     General: Skin is warm.      Findings: No lesion or rash.   Neurological:      General: No focal deficit present.      Mental Status: He is  alert and oriented to person, place, and time.   Psychiatric:         Mood and Affect: Mood normal.         Behavior: Behavior normal.         Thought Content: Thought content normal.         Judgment: Judgment normal.       Assessment/Plan:  Problem List Items Addressed This Visit          Cardiovascular System    Hypertension - Primary    Hyperlipidemia    Relevant Orders    LIPID PANEL       Endocrine    Vitamin D deficiency    Relevant Orders    VITAMIN D 25 TOTAL    Low testosterone    Relevant Orders    CBC/DIFF    TESTOSTERONE FREE (DIALYSIS) AND TOTAL,MS       Psychiatric    Anxiety       Other    Morbid obesity (CMS HCC)    Need for Tdap vaccination     Other Visit Diagnoses       Fatigue, unspecified type        Relevant Orders    CBC/DIFF    COMPREHENSIVE METABOLIC PNL, FASTING    MAGNESIUM    IRON TRANSFERRIN AND TIBC    TESTOSTERONE FREE (DIALYSIS) AND TOTAL,MS    THYROID STIMULATING HORMONE WITH FREE T4 REFLEX    VITAMIN B12    FOLATE    Prostate cancer screening        Relevant Orders    PSA, DIAGNOSTIC               Depression screening is negative. PHQ 2  Total: 0      Labs ordered at [facility]  Tdap given  Continue wegovy 1mg  weekly  Continue strict diet exercise wt loss  F/u pending lab results otherwise as scheduled    Follow up:   Post-Discharge Follow Up Appointments       Tuesday Nov 03, 2022    Return Patient Visit with Nov 05, 2022, PA-C at  8:00 AM      Internal Medicine, [facility]  [facility]  [address]  [place]  [phone]           Seek medical attention for new or worsening symptoms.  Patient has been seen in this clinic within the last 3 years.     Amy [phone], PA-C        I personally reviewed the documentation of care provided by the certified physician assistant and I agree with her medical decision making, Hyman Bible, D.O.     This note was partially created using MModal Fluency Direct system (voice recognition software) and is inherently  subject to errors including those of syntax and "sound-alike" substitutions which may escape proofreading.  In such instances, original meaning may be extrapolated by contextual derivation.

## 2022-07-06 NOTE — Nursing Note (Signed)
Patient in office for 3 month CDM

## 2022-07-15 ENCOUNTER — Other Ambulatory Visit (INDEPENDENT_AMBULATORY_CARE_PROVIDER_SITE_OTHER): Payer: Self-pay | Admitting: Physician Assistant

## 2022-07-15 ENCOUNTER — Other Ambulatory Visit (INDEPENDENT_AMBULATORY_CARE_PROVIDER_SITE_OTHER): Payer: Self-pay

## 2022-07-15 DIAGNOSIS — E785 Hyperlipidemia, unspecified: Secondary | ICD-10-CM

## 2022-07-15 DIAGNOSIS — E559 Vitamin D deficiency, unspecified: Secondary | ICD-10-CM

## 2022-07-15 DIAGNOSIS — R7989 Other specified abnormal findings of blood chemistry: Secondary | ICD-10-CM

## 2022-07-15 DIAGNOSIS — R5383 Other fatigue: Secondary | ICD-10-CM

## 2022-07-15 DIAGNOSIS — Z125 Encounter for screening for malignant neoplasm of prostate: Secondary | ICD-10-CM

## 2022-07-15 DIAGNOSIS — R030 Elevated blood-pressure reading, without diagnosis of hypertension: Secondary | ICD-10-CM

## 2022-07-15 NOTE — Telephone Encounter (Signed)
Patient's [facility] lab testo came back low an he wants to replace it.

## 2022-07-16 MED ORDER — TESTOSTERONE CYPIONATE 200 MG/ML INTRAMUSCULAR OIL
TOPICAL_OIL | INTRAMUSCULAR | 0 refills | Status: DC
Start: 2022-07-16 — End: 2023-01-04

## 2022-08-04 ENCOUNTER — Other Ambulatory Visit (INDEPENDENT_AMBULATORY_CARE_PROVIDER_SITE_OTHER): Payer: Self-pay | Admitting: Physician Assistant

## 2022-08-04 MED ORDER — PHENTERMINE 37.5 MG TABLET
37.5000 mg | ORAL_TABLET | Freq: Every morning | ORAL | 0 refills | Status: AC
Start: 2022-08-04 — End: 2022-09-03

## 2022-09-30 ENCOUNTER — Other Ambulatory Visit (INDEPENDENT_AMBULATORY_CARE_PROVIDER_SITE_OTHER): Payer: Self-pay | Admitting: Physician Assistant

## 2022-10-02 ENCOUNTER — Telehealth (INDEPENDENT_AMBULATORY_CARE_PROVIDER_SITE_OTHER): Payer: Self-pay | Admitting: Physician Assistant

## 2022-10-05 ENCOUNTER — Ambulatory Visit: Payer: BC Managed Care – PPO | Attending: Physician Assistant | Admitting: Physician Assistant

## 2022-10-05 ENCOUNTER — Other Ambulatory Visit: Payer: Self-pay

## 2022-10-05 ENCOUNTER — Encounter (INDEPENDENT_AMBULATORY_CARE_PROVIDER_SITE_OTHER): Payer: Self-pay | Admitting: Physician Assistant

## 2022-10-05 VITALS — BP 124/84 | HR 87 | Temp 97.4°F | Ht 67.0 in | Wt 323.0 lb

## 2022-10-05 DIAGNOSIS — F419 Anxiety disorder, unspecified: Secondary | ICD-10-CM | POA: Insufficient documentation

## 2022-10-05 DIAGNOSIS — Z0130 Encounter for examination of blood pressure without abnormal findings: Secondary | ICD-10-CM | POA: Insufficient documentation

## 2022-10-05 DIAGNOSIS — R5383 Other fatigue: Secondary | ICD-10-CM | POA: Insufficient documentation

## 2022-10-05 DIAGNOSIS — R7989 Other specified abnormal findings of blood chemistry: Secondary | ICD-10-CM | POA: Insufficient documentation

## 2022-10-05 MED ORDER — PHENTERMINE 37.5 MG CAPSULE
37.5000 mg | ORAL_CAPSULE | Freq: Every day | ORAL | 2 refills | Status: DC
Start: 2022-10-05 — End: 2023-04-08

## 2022-10-05 NOTE — Progress Notes (Signed)
INTERNAL MEDICINE, [facility]  [address]  [place] [address]          Follow Up        Reason for Visit: Follow Up 3 Months (Routine ), Low Testosterone, Medication Refill, and Blood Work    History of Present Illness  Wayne Moody. is a 46 y.o. male who is being seen today in office for f/u concerning low testosterone with chronic fatigue issues noted.  Patient was started on testosterone injections for which he is now on 1 mL every 2 weeks.  He states he can tell a big difference with the injections and they have helped his energy level.  He denies any side effects or issues including shortness of breath flushing headaches.  Follow-up labs needed.    Follow-up blood pressure with some borderline readings in the past as well as stimulant use currently.  Blood pressure today stable at 124/84 and patient requesting a refill on Adipex 37.5 mg daily for weight loss.  Patient states he is currently out of the medication so he may of gained couple lb.  He is tried several other meds in the past including Wegovy without success.  Tells me today that the Adipex seems to work better than the other ones.  Weight today is 323.    F/U anxiety w pt using valium prn. At this time states anxiety is stable and he is doing well. Denies any depression issues. Does not need refill of med today.    PHQ Questionnaire         Past Medical History:   Diagnosis Date    Allergic rhinitis     Anxiety     Ear pain     Hypertension          Past Surgical History:   Procedure Laterality Date    OTHER SURGICAL HISTORY      urology procedure      Family Medical History:       Problem Relation (Age of Onset)    Cancer Mother            Social History     Tobacco Use    Smoking status: Never    Smokeless tobacco: Never   Substance Use Topics    Alcohol use: Yes     Comment: Holidays    Drug use: Never     Medication:  montelukast (SINGULAIR) 10 mg Oral Tablet, Take 1 Tablet (10 mg total) by mouth Once a day  testosterone  cypionate (DEPO-TESTOTERONE) 200 mg/mL IntraMUSCULAR Oil, 1 cc IM weekly x 6 weeks then q 2 weeks as directed for low testosterone  diazePAM (VALIUM) 5 mg Oral Tablet, Take 1 Tablet (5 mg total) by mouth Every 6 hours as needed for Anxiety  phentermine (ADIPEX-P) 37.5 mg Oral Capsule, Take 1 Capsule (37.5 mg total) by mouth Once a day  semaglutide, weight loss, (WEGOVY) 1 mg/0.5 mL Subcutaneous Pen Injector, Inject 0.5 mL (1 mg total) under the skin Every 7 days    No facility-administered medications prior to visit.    Allergies:  Allergies   Allergen Reactions    Shellfish Derived Swelling       Physical Exam:  Vitals:    10/05/22 0909   BP: 124/84   Pulse: 87   Temp: 36.3 C (97.4 F)   TempSrc: Tympanic   SpO2: 97%   Weight: (!) 147 kg (323 lb)   Height: 1.702 m (5\' 7" )   BMI: 50.69  Physical Exam  Vitals and nursing note reviewed.   Constitutional:       General: He is not in acute distress.     Appearance: Normal appearance. He is obese.   HENT:      Right Ear: Tympanic membrane normal.      Left Ear: Tympanic membrane normal.      Mouth/Throat:      Mouth: Mucous membranes are moist.      Pharynx: Oropharynx is clear.   Cardiovascular:      Rate and Rhythm: Normal rate and regular rhythm.      Pulses: Normal pulses.      Heart sounds: Normal heart sounds.   Pulmonary:      Effort: Pulmonary effort is normal.      Breath sounds: Normal breath sounds.   Abdominal:      Palpations: Abdomen is soft.      Tenderness: There is no abdominal tenderness.   Musculoskeletal:         General: No swelling or tenderness. Normal range of motion.      Cervical back: Normal range of motion and neck supple.   Skin:     General: Skin is warm.      Findings: No lesion or rash.   Neurological:      General: No focal deficit present.      Mental Status: He is alert and oriented to person, place, and time.   Psychiatric:         Mood and Affect: Mood normal.         Behavior: Behavior normal.         Thought Content: Thought  content normal.         Judgment: Judgment normal.         Assessment/Plan:  Problem List Items Addressed This Visit          Endocrine    Low testosterone - Primary    Relevant Orders    CBC/DIFF    TESTOSTERONE FREE (DIALYSIS) AND TOTAL,MS    ESTRADIOL       Psychiatric    Anxiety       Other    Blood pressure check    Morbid obesity (CMS HCC)    Fatigue    Relevant Orders    CBC/DIFF    TESTOSTERONE FREE (DIALYSIS) AND TOTAL,MS    ESTRADIOL            Labs ordered to be obtained at [facility]  Adipex refilled as noted  Continue strict diet exercise weight loss efforts  Continue blood pressure monitoring outpatient  Will follow-up pending lab results otherwise is scheduled        Follow up:   Post-Discharge Follow Up Appointments       Monday Jan 04, 2023    Return Patient Visit with Eyvonne Mechanic, PA-C at  9:30 AM      Internal Medicine, [facility]  [facility]  [address]  [place] [address]  [phone]           Seek medical attention for new or worsening symptoms.  Patient has been seen in this clinic within the last 3 years.     Amy Hyman Bible, PA-C        I personally reviewed the documentation of care provided by the certified physician assistant and I agree with her medical decision making, Weyman Pedro, D.O.     This note was partially created using MModal Fluency Direct  system (voice recognition software) and is inherently subject to errors including those of syntax and "sound-alike" substitutions which may escape proofreading.  In such instances, original meaning may be extrapolated by contextual derivation.

## 2022-10-05 NOTE — Nursing Note (Signed)
Patient is here for routine 3 month follow up for medication refills.

## 2022-10-20 ENCOUNTER — Telehealth (INDEPENDENT_AMBULATORY_CARE_PROVIDER_SITE_OTHER): Payer: Self-pay | Admitting: Physician Assistant

## 2022-10-21 MED ORDER — AZITHROMYCIN 250 MG TABLET
ORAL_TABLET | ORAL | 0 refills | Status: DC
Start: 2022-10-21 — End: 2023-01-04

## 2022-10-21 MED ORDER — PROMETHAZINE-DM 6.25 MG-15 MG/5 ML ORAL SYRUP
5.0000 mL | ORAL_SOLUTION | Freq: Four times a day (QID) | ORAL | 0 refills | Status: AC | PRN
Start: 2022-10-21 — End: 2022-10-28

## 2022-11-03 ENCOUNTER — Encounter (INDEPENDENT_AMBULATORY_CARE_PROVIDER_SITE_OTHER): Payer: Self-pay | Admitting: Physician Assistant

## 2022-12-25 ENCOUNTER — Other Ambulatory Visit (INDEPENDENT_AMBULATORY_CARE_PROVIDER_SITE_OTHER): Payer: Self-pay | Admitting: Physician Assistant

## 2023-01-04 ENCOUNTER — Other Ambulatory Visit: Payer: Self-pay

## 2023-01-04 ENCOUNTER — Encounter (INDEPENDENT_AMBULATORY_CARE_PROVIDER_SITE_OTHER): Payer: Self-pay | Admitting: Physician Assistant

## 2023-01-04 ENCOUNTER — Ambulatory Visit: Payer: BC Managed Care – PPO | Attending: Physician Assistant | Admitting: Physician Assistant

## 2023-01-04 VITALS — BP 124/88 | HR 100 | Temp 98.7°F | Wt 316.0 lb

## 2023-01-04 DIAGNOSIS — M2560 Stiffness of unspecified joint, not elsewhere classified: Secondary | ICD-10-CM | POA: Insufficient documentation

## 2023-01-04 DIAGNOSIS — Z1159 Encounter for screening for other viral diseases: Secondary | ICD-10-CM | POA: Insufficient documentation

## 2023-01-04 DIAGNOSIS — R7989 Other specified abnormal findings of blood chemistry: Secondary | ICD-10-CM | POA: Insufficient documentation

## 2023-01-04 DIAGNOSIS — G8929 Other chronic pain: Secondary | ICD-10-CM | POA: Insufficient documentation

## 2023-01-04 DIAGNOSIS — M791 Myalgia, unspecified site: Secondary | ICD-10-CM | POA: Insufficient documentation

## 2023-01-04 DIAGNOSIS — E785 Hyperlipidemia, unspecified: Secondary | ICD-10-CM | POA: Insufficient documentation

## 2023-01-04 DIAGNOSIS — E612 Magnesium deficiency: Secondary | ICD-10-CM | POA: Insufficient documentation

## 2023-01-04 DIAGNOSIS — M2550 Pain in unspecified joint: Secondary | ICD-10-CM | POA: Insufficient documentation

## 2023-01-04 DIAGNOSIS — R5383 Other fatigue: Secondary | ICD-10-CM

## 2023-01-04 DIAGNOSIS — F419 Anxiety disorder, unspecified: Secondary | ICD-10-CM | POA: Insufficient documentation

## 2023-01-04 DIAGNOSIS — U099 Post covid-19 condition, unspecified: Secondary | ICD-10-CM

## 2023-01-04 NOTE — Nursing Note (Signed)
Patient is here for 69m CDM. Pt is complaining of bilateral knee pain, right worse than left

## 2023-01-04 NOTE — Progress Notes (Signed)
INTERNAL MEDICINE, BUILDING A  [address]  [place] [address]          Follow Up        Reason for Visit: Follow Up 3 Months joint/muscle pain    History of Present Illness  Wayne Monte. is a 47 y.o. male who is being seen today in office for follow-up however complaining of soreness all over including his muscles joints specifically knees.  He states his knees have been more stiff as well increase in the morning gradually getting better throughout the day.  He has had no new medication start and denies any red-hot inflamed joints.  He states all these symptoms started after he had COVID at Christmas.  He has had no increased activity or change in activity.    Follow-up cholesterol/lipids with patient currently not on statin therapy.  He has had some borderline elevated levels with history of Weight gain and obesity.  Patient has tried several treatments for weight loss including the injectables however some side effects noted so he is currently using Adipex.  He does admit to being active.  Currently weight is 310 lb with a BMI of 44.48.    Follow-up low testosterone for which patient previously on testosterone supplements.  He has currently not taking medication but would like for levels to be checked again due to his current complaints.  He does have fatigue as well noted.    F/U anxiety w pt using valium prn. At this time states anxiety is stable and he is doing well. Denies any depression issues. Does not need refill of med today.    PHQ Questionnaire  Little interest or pleasure in doing things.: Not at all  Feeling down, depressed, or hopeless: Not at all  PHQ 2 Total: 0      History reviewed. No pertinent past medical history.        No past surgical history on file.     Family Medical History:       Problem Relation (Age of Onset)    Cancer Mother            Social History     Tobacco Use    Smoking status: Never    Smokeless tobacco: Never   Vaping Use    Vaping status: Never Used    Substance Use Topics    Alcohol use: Never    Drug use: Never     Medication:  montelukast (SINGULAIR) 10 mg Oral Tablet, Take 1 tablet by mouth once daily  phentermine (ADIPEX-P) 37.5 mg Oral Capsule, Take 1 Capsule (37.5 mg total) by mouth Once a day  azithromycin (ZITHROMAX) 250 mg Oral Tablet, Take 500 mg (2 tab) on day 1; take 250 mg (1 tab) on days 2-5.  testosterone cypionate (DEPO-TESTOTERONE) 200 mg/mL IntraMUSCULAR Oil, 1 cc IM weekly x 6 weeks then q 2 weeks as directed for low testosterone    No facility-administered medications prior to visit.    Allergies:  Allergies   Allergen Reactions    Iodine Itching and Swelling    Shellfish Derived Swelling       Physical Exam:  Vitals:    01/04/23 0951   BP: 124/88   Pulse: 100   Temp: 37.1 C (98.7 F)   SpO2: 94%   Weight: (!) 143 kg (316 lb)        General appearance: alert, cooperative, in no acute distress  Obesity noted  HEENT: Ears: clear, no effusion, no  erythema; Throat: non-erythematous; no LAD, neck supple, moist mucus membranes  Lungs: clear to auscultation bilaterally; no wheezes or rhonchi   Heart: regular rate and rhythm; normal S1 & S2; no murmur  Abdomen: soft, non-tender, obese bowel sounds present  Extremities: extremities normal ROM, no cyanosis or edema , no rash  No red hot inflamed joints noted  Gait stable  Psych: alert and oriented x 3  Neuro: CN 2-12 grossly intact    Assessment/Plan:  Problem List Items Addressed This Visit       Anxiety    Hyperlipidemia    Relevant Orders    COMPREHENSIVE METABOLIC PNL, FASTING (Completed)    LIPID PANEL (Completed)    Low testosterone    Relevant Orders    TESTOSTERONE FREE (DIALYSIS) AND TOTAL,MS    Fatigue    Relevant Orders    THYROID STIMULATING HORMONE WITH FREE T4 REFLEX (Completed)    CBC/DIFF (Completed)    COMPREHENSIVE METABOLIC PNL, FASTING (Completed)     Other Visit Diagnoses       Post-COVID chronic joint pain    -  Primary    Relevant Orders    CREATINE KINASE (CK), TOTAL, SERUM OR  PLASMA (Completed)    RHEUMATOID FACTOR, SERUM (Completed)    HEP-2 SUBSTRATE ANTINUCLEAR ANTIBODIES (ANA), SERUM (Completed)    SEDIMENTATION RATE (Completed)    LYME ANTIBODY PANEL WITH REFLEX (Completed)    MAGNESIUM (Completed)    URIC ACID (Completed)    Generalized muscle ache        Relevant Orders    CREATINE KINASE (CK), TOTAL, SERUM OR PLASMA (Completed)    RHEUMATOID FACTOR, SERUM (Completed)    HEP-2 SUBSTRATE ANTINUCLEAR ANTIBODIES (ANA), SERUM (Completed)    SEDIMENTATION RATE (Completed)    LYME ANTIBODY PANEL WITH REFLEX (Completed)    MAGNESIUM (Completed)    Joint stiffness        Relevant Orders    CREATINE KINASE (CK), TOTAL, SERUM OR PLASMA (Completed)    RHEUMATOID FACTOR, SERUM (Completed)    HEP-2 SUBSTRATE ANTINUCLEAR ANTIBODIES (ANA), SERUM (Completed)    SEDIMENTATION RATE (Completed)    LYME ANTIBODY PANEL WITH REFLEX (Completed)    URIC ACID (Completed)    Magnesium deficiency        Relevant Orders    MAGNESIUM (Completed)    Need for hepatitis C screening test        Relevant Orders    HEPATITIS C ANTIBODY SCREEN WITH REFLEX TO HCV PCR (Completed)             Labs obtained at [facility] including arthritis workup  Encouraged hydration  Meds up-to-date this time  Follow-up pending lab results otherwise is scheduled      Follow up:   Post-Discharge Follow Up Appointments       Thursday Apr 08, 2023    Return Patient Visit with Eyvonne Mechanic, PA-C at 10:00 AM      Internal Medicine, Building [facility]  [facility]  [address]  [place] [address]  [phone]           Seek medical attention for new or worsening symptoms.  Patient has been seen in this clinic within the last 3 years.     Myrah Strawderman, PA-C          This note was partially created using MModal Fluency Direct system (voice recognition software) and is inherently subject to errors including those of syntax and "sound-alike" substitutions which may escape proofreading.  In such instances, original meaning may be  extrapolated by contextual derivation.

## 2023-01-05 LAB — CREATINE KINASE (CK), TOTAL, SERUM OR PLASMA: CREATINE KINASE (CK): 95

## 2023-01-05 LAB — COMPREHENSIVE METABOLIC PNL, FASTING: ESTIMATED GLOMERULAR FILTRATION RATE: 76

## 2023-01-06 ENCOUNTER — Other Ambulatory Visit (INDEPENDENT_AMBULATORY_CARE_PROVIDER_SITE_OTHER): Payer: Self-pay

## 2023-01-06 DIAGNOSIS — E612 Magnesium deficiency: Secondary | ICD-10-CM

## 2023-01-06 DIAGNOSIS — M2560 Stiffness of unspecified joint, not elsewhere classified: Secondary | ICD-10-CM

## 2023-01-06 DIAGNOSIS — M2550 Pain in unspecified joint: Secondary | ICD-10-CM

## 2023-01-06 DIAGNOSIS — E785 Hyperlipidemia, unspecified: Secondary | ICD-10-CM

## 2023-01-06 DIAGNOSIS — M791 Myalgia, unspecified site: Secondary | ICD-10-CM

## 2023-01-06 DIAGNOSIS — R5383 Other fatigue: Secondary | ICD-10-CM

## 2023-01-06 DIAGNOSIS — Z1159 Encounter for screening for other viral diseases: Secondary | ICD-10-CM

## 2023-01-19 ENCOUNTER — Emergency Department (HOSPITAL_COMMUNITY): Payer: Worker's Compensation

## 2023-01-19 ENCOUNTER — Other Ambulatory Visit: Payer: Self-pay

## 2023-01-19 ENCOUNTER — Emergency Department
Admission: EM | Admit: 2023-01-19 | Discharge: 2023-01-19 | Disposition: A | Discharge status: Home / Self Care | Payer: Worker's Compensation | Attending: NURSE PRACTITIONER | Admitting: NURSE PRACTITIONER

## 2023-01-19 ENCOUNTER — Encounter (HOSPITAL_COMMUNITY): Payer: Self-pay

## 2023-01-19 DIAGNOSIS — S99921A Unspecified injury of right foot, initial encounter: Secondary | ICD-10-CM

## 2023-01-19 DIAGNOSIS — S9031XA Contusion of right foot, initial encounter: Secondary | ICD-10-CM | POA: Insufficient documentation

## 2023-01-19 DIAGNOSIS — Y990 Civilian activity done for income or pay: Secondary | ICD-10-CM

## 2023-01-19 DIAGNOSIS — M25561 Pain in right knee: Secondary | ICD-10-CM | POA: Insufficient documentation

## 2023-01-19 DIAGNOSIS — S9032XA Contusion of left foot, initial encounter: Secondary | ICD-10-CM | POA: Insufficient documentation

## 2023-01-19 DIAGNOSIS — R42 Dizziness and giddiness: Secondary | ICD-10-CM | POA: Insufficient documentation

## 2023-01-19 DIAGNOSIS — S9030XA Contusion of unspecified foot, initial encounter: Secondary | ICD-10-CM

## 2023-01-19 DIAGNOSIS — M25562 Pain in left knee: Secondary | ICD-10-CM | POA: Insufficient documentation

## 2023-01-19 DIAGNOSIS — W1789XA Other fall from one level to another, initial encounter: Secondary | ICD-10-CM | POA: Insufficient documentation

## 2023-01-19 DIAGNOSIS — S99922A Unspecified injury of left foot, initial encounter: Secondary | ICD-10-CM

## 2023-01-19 DIAGNOSIS — R55 Syncope and collapse: Secondary | ICD-10-CM | POA: Insufficient documentation

## 2023-01-19 LAB — POC BLOOD GLUCOSE (RESULTS): GLUCOSE, POC: 124 mg/dl — ABNORMAL HIGH (ref 70–100)

## 2023-01-19 MED ORDER — KETOROLAC 60 MG/2 ML INTRAMUSCULAR SOLUTION
60.0000 mg | INTRAMUSCULAR | Status: AC
Start: 2023-01-19 — End: 2023-01-19
  Administered 2023-01-19: 60 mg via INTRAMUSCULAR

## 2023-01-19 MED ORDER — SODIUM CHLORIDE 0.9 % IV BOLUS
1000.0000 mL | INJECTION | Status: AC
Start: 2023-01-19 — End: 2023-01-19
  Administered 2023-01-19: 0 mL via INTRAVENOUS
  Administered 2023-01-19: 1000 mL via INTRAVENOUS

## 2023-01-19 MED ORDER — SODIUM CHLORIDE 0.9 % (FLUSH) INJECTION SYRINGE
3.0000 mL | INJECTION | Freq: Three times a day (TID) | INTRAMUSCULAR | Status: DC
Start: 2023-01-19 — End: 2023-01-19

## 2023-01-19 MED ORDER — NAPROXEN 500 MG TABLET
500.0000 mg | ORAL_TABLET | Freq: Two times a day (BID) | ORAL | 0 refills | Status: DC
Start: 2023-01-19 — End: 2023-04-08

## 2023-01-19 MED ORDER — SODIUM CHLORIDE 0.9 % (FLUSH) INJECTION SYRINGE
3.0000 mL | INJECTION | INTRAMUSCULAR | Status: DC | PRN
Start: 2023-01-19 — End: 2023-01-19

## 2023-01-19 MED ORDER — KETOROLAC 60 MG/2 ML INTRAMUSCULAR SOLUTION
INTRAMUSCULAR | Status: AC
Start: 2023-01-19 — End: 2023-01-19
  Filled 2023-01-19: qty 2

## 2023-01-19 NOTE — ED Nurses Note (Signed)
Patient to ED25 following a repelling accident this morning at work. Patient states that he was repelling out of a bucket truck for work training and his rope did not catch. Patient reports pain in bilateral knees and ankles. Edema noted in bilateral feet. Denies hitting anything else besides landing on his heels when the accident occurred. Placed on bedside monitor.

## 2023-01-19 NOTE — ED Triage Notes (Signed)
FELL WHILE REPELLING APPROX 15FT. C/O L KNEE, R ANKLE AND HEEL PAIN AT [id number].

## 2023-01-19 NOTE — ED Provider Notes (Signed)
[facility]  Emergency Department  Advanced Practice Provider Note      CHIEF COMPLAINT  Chief Complaint   Patient presents with    Fall     HISTORY OF PRESENT ILLNESS  Wayne Magnifico., date of birth 1976/05/20, is a 47 y.o. male who presented to the Emergency Department.    Patient is a 47 year old male who presents to after injury at work.  Reports that they were completing some training and he was elevated in a harness.  Reports his harness slipped and he fell toward the ground.  States he flexed his feet with heels pointed to the ground when his harness started working again and he jammed his heels into the ground.  Patient is complaining of bilateral heel and foot pain.  Reports his right foot hurts worse than his left.  Also complaining of bilateral knee pain, left knee worse than the right.  Patient does have bruising noted to the feet.  Patient denies any back or neck pain.  Describes pain as achy and throbbing.  Reports pain is worse with weight-bearing or walking.  Denies any home treatment prior to ER arrival.      PAST MEDICAL/SURGICAL/FAMILY/SOCIAL HISTORY  Past Medical History:   Diagnosis Date    Allergic rhinitis     Anxiety     Ear pain     Hypertension        Past Surgical History:   Procedure Laterality Date    OTHER SURGICAL HISTORY      urology procedure       Family Medical History:       Problem Relation (Age of Onset)    Cancer Mother          Social History     Socioeconomic History    Marital status: Unknown   Tobacco Use    Smoking status: Never    Smokeless tobacco: Never   Substance and Sexual Activity    Alcohol use: Yes     Comment: Holidays    Drug use: Never      ALLERGIES  Allergies   Allergen Reactions    Shellfish Derived Swelling       PHYSICAL EXAM  VITAL SIGNS:  Filed Vitals:    01/19/23 1451   BP: (!) 164/98   Pulse: 94   Resp: 18   Temp: 37 C (98.6 F)   SpO2: 98%     Constitutional: Awake. Well appearing. Overweight. No distress noted.    Cardiovascular: RRR, S1, S2.   Pulmonary/Chest: Breath sounds clear and equal bilaterally. No respiratory distress. No wheezes, rales or chest tenderness.           Musculoskeletal: Bruising noted to bilateral heels. Normal muscle tone and strength. No spinal tenderness  Skin: warm and dry.   Psychiatric: normal mood and affect. Behavior is normal.   Neurological: Alert, oriented. Normal gait. No focal weakness noted. No sensory deficit    Nursing notes reviewed.     DIAGNOSTICS  Labs:  Labs listed below were reviewed and interpreted by me.  No results found for any visits on 01/19/23.  Radiology:       ED COURSE/MEDICAL DECISION MAKING  Medications Administered in the ED   ketorolac (TORADOL) 60mg /2 mL IM injection (has no administration in time range)      ED Course as of 01/19/23 1707   Tue Jan 19, 2023   1705 XR FEET BILATERAL  IMPRESSION:  SOFT TISSUE SWELLING. NO  FRACTURE  IDENTIFIED.      1706 XR KNEE RIGHT 2 VIEW  IMPRESSION:  NEGATIVE KNEE SERIES      1706 XR KNEE LEFT 2 VIEW  IMPRESSION:  NO EFFUSION, ACUTE FRACTURE OR DISLOCATION.        1706 XR LUMBAR SPINE AP AND LAT  IMPRESSION:  NEGATIVE LUMBAR SPINE.     1706 XR THORACIC SPINE 2 VIEW  IMPRESSION:  NEGATIVE THORACIC SPINE X-RAYS.     1707 XR HEEL RIGHT (CALCANEUS)  IMPRESSION:  NO ACUTE FRACTURE.        1707 XR HEEL LEFT (CALCANEUS)  IMPRESSION:  NO ACUTE FRACTURE.           Medical Decision Making  Patient is a 47 year old male who presents to the ER after injury at work.  Patient was propelled downward while wearing a harness and jammed his feet into the pavement.  Patient is complaining of bilateral knee and bilateral foot pain. patient does have bruising noted to bilateral feet.    Patient's list of differential diagnosis includes but is not limited to calcaneus fracture, metatarsal fracture, ankle fracture, knee fracture, knee dislocation, ACL, MCL, spinal fracture    Patient's x-rays are unremarkable except for some soft tissue swelling the  bilateral feet.    Patient became diaphoretic and dizzy complaining that he felt near syncope.  Patient is denying any new or worsening pain.  Patient's glucose fingerstick was 124.  IV started and patient will be given a L of normal saline.  ECG ordered.      ECG shows NSR with rate of 84. PR interval 174. QRS 80. QT/QTc 372/439. T wave inverted in aVR, V1. Upright in all other leads. Normal Axis. No ST elevation    Patient states he is feeling much better after IV fluids. Declining lab work at this time. Eager to be discharged home. Given strict return to ED precautions    Following the above history, physical exam, and findings- the patient was deemed stable and suitable for discharge.  Discharge and medication instructions were discussed with the patient and all questions were addressed.  The patient understands that they may return to the ED at any time for new or worsening symptoms or if they have any other concerns.  The patient is to follow up with PCP . Patient verbalized understanding and agrees to plan of care     Problems Addressed:  Contusion of foot or heel, initial encounter: acute illness or injury    Amount and/or Complexity of Data Reviewed  Radiology: ordered. Decision-making details documented in ED Course.  ECG/medicine tests: ordered. Decision-making details documented in ED Course.    Risk  Prescription drug management.      CLINICAL IMPRESSION  Clinical Impression   Fall from one level to another, initial encounter (Primary)   Injury of right foot, initial encounter   Foot injury, left, initial encounter   Pain in both knees, unspecified chronicity   Contusion of foot or heel, initial encounter     DISPOSITION  Discharged       [facility]  Discharge Medication List as of 01/19/2023  6:17 PM        START taking these medications    Details   naproxen (NAPROSYN) 500 mg Oral Tablet Take 1 Tablet (500 mg total) by mouth Twice daily with food, Disp-30 Tablet, R-0, E-Rx             Hewitt Shorts, FNP-C 01/19/2023, 15:29   [facility]  Department of Emergency [place]    This note was partially generated using MModal Fluency Direct system, and there may be some incorrect words, spellings, and punctuation that were not noted in checking the note before saving.    -----

## 2023-01-19 NOTE — ED Nurses Note (Signed)
Patient discharged home with family.  AVS reviewed with patient/care giver.  A written copy of the AVS and discharge instructions was given to the patient/care giver.  Questions sufficiently answered as needed.  Patient/care giver encouraged to follow up with PCP as indicated.  In the event of an emergency, patient/care giver instructed to call 911 or go to the nearest emergency room. Patient out of ED at this time in wheelchair with crutches and family present. Educated on crutches use.

## 2023-01-19 NOTE — ED Nurses Note (Signed)
Patient did not wish to have labs completed at this time reports that he feels much better.

## 2023-01-19 NOTE — ED Nurses Note (Addendum)
Family reports patient feels like he is going to vomit at this time. Patient clammy and pale. Patient is visibly sweating at this time. Denies any chest pain or trouble breathing said I am just sweating and I have to pee. Patient blood pressure assessed. ED provider notified of this information.

## 2023-01-19 NOTE — Discharge Instructions (Addendum)
Continue to monitor symptoms.  Alternate Tylenol, ibuprofen or naproxen as needed for discomfort.  Use ice or heat to help alleviate discomfort.  No strenuous activity for the next few days.  Follow-up with your family doctor within 1 week.  Return to ER for any new or worsening symptoms.

## 2023-01-20 ENCOUNTER — Telehealth (INDEPENDENT_AMBULATORY_CARE_PROVIDER_SITE_OTHER): Payer: Self-pay | Admitting: Physician Assistant

## 2023-01-20 DIAGNOSIS — R55 Syncope and collapse: Secondary | ICD-10-CM

## 2023-01-20 DIAGNOSIS — R42 Dizziness and giddiness: Secondary | ICD-10-CM

## 2023-01-20 LAB — ECG 12 LEAD
Atrial Rate: 84 {beats}/min
Calculated P Axis: 35 degrees
Calculated R Axis: 95 degrees
Calculated T Axis: 41 degrees
PR Interval: 174 ms
QRS Duration: 80 ms
QT Interval: 372 ms
QTC Calculation: 439 ms
Ventricular rate: 84 {beats}/min

## 2023-01-31 ENCOUNTER — Encounter (INDEPENDENT_AMBULATORY_CARE_PROVIDER_SITE_OTHER): Payer: Self-pay | Admitting: Physician Assistant

## 2023-03-02 IMAGING — MR MRI KNEE LT W/O CONTRAST
5 series · 36 of 40 positions shown · IV contrast (gadolinium)
Comparison: None available.

﻿EXAM:  [id number]   MRI KNEE LT W/O CONTRAST
INDICATION: 46-year-old sustained trauma due to fall 1 month ago with hyperextension of the left knee.  Positive McMurray test.  Persistent pain and diminished range of motion.
TECHNIQUE: Multiplanar multisequential MRI of the left knee was performed without gadolinium contrast.

[Series 5: PD fat-sat · axial · left · 4.0mm · 0.33mm/px · z∈[-96,+16]mm · 7 of 26 slices shown (1 of 3)]
[im 1/26]
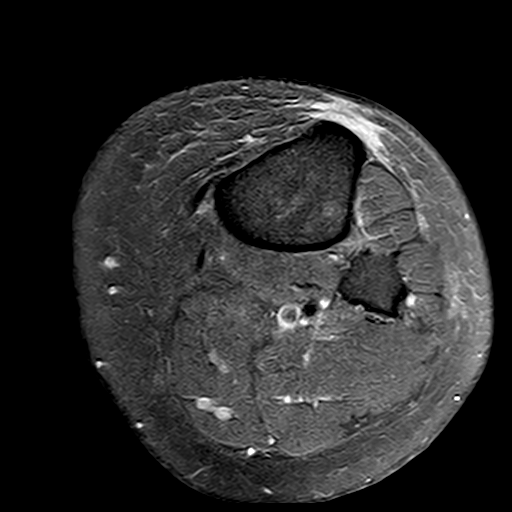
[im 5/26]
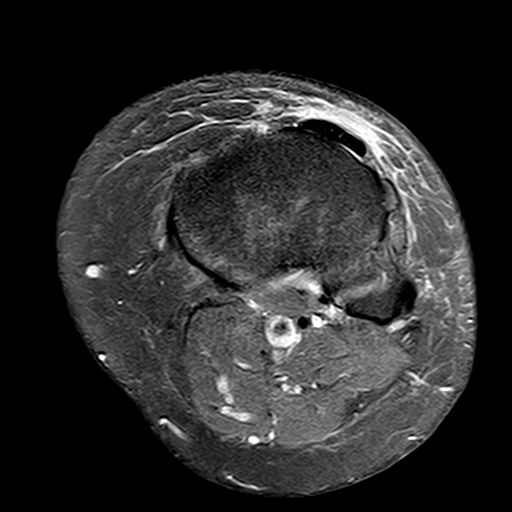
[im 9/26]
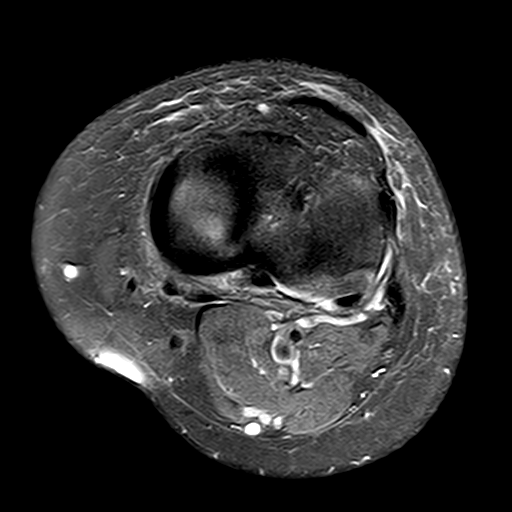
[im 13/26]
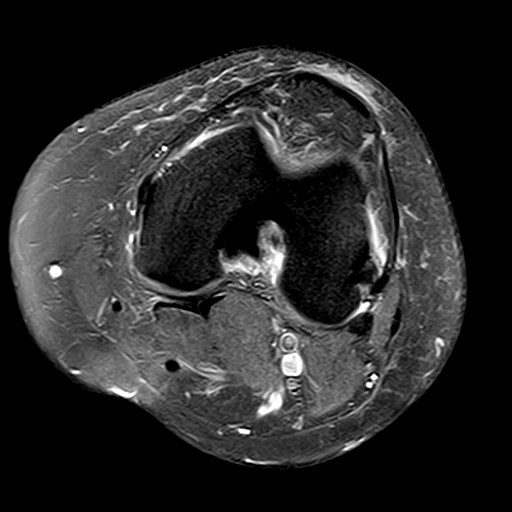
[im 17/26]
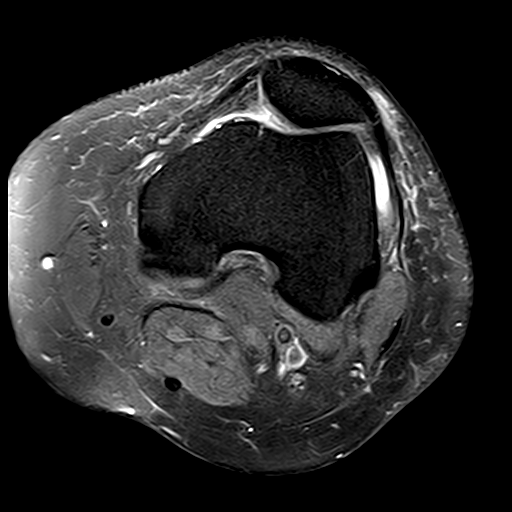
[im 21/26]
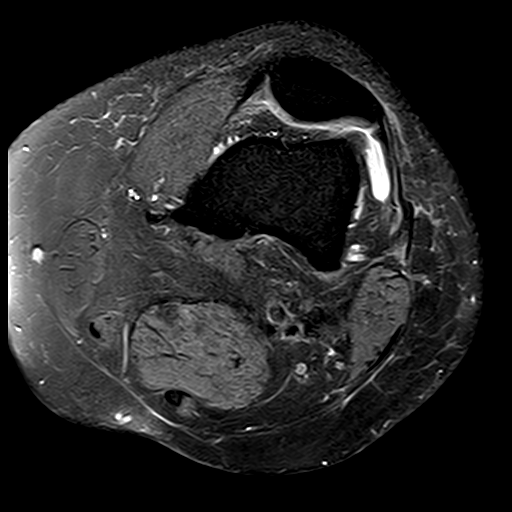
[im 26/26]
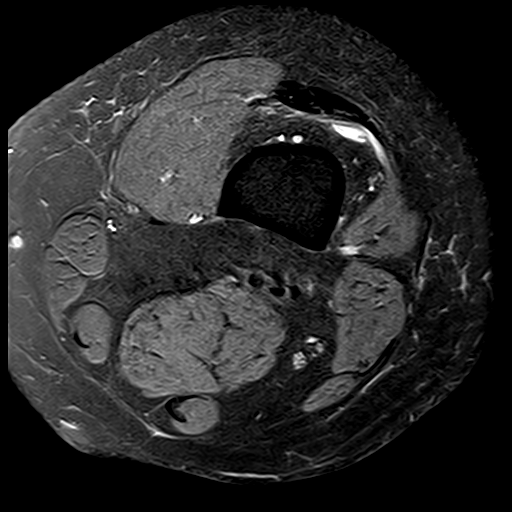

[Series 6: PD fat-sat · sagittal · left · 3.5mm · 0.53mm/px · 7 of 26 slices shown (2 of 3)]
[im 1/26]
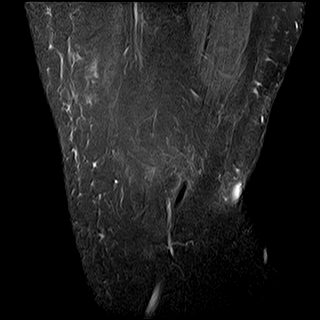
[im 5/26]
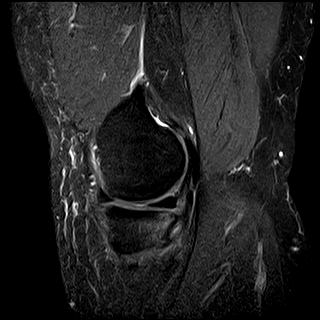
[im 9/26]
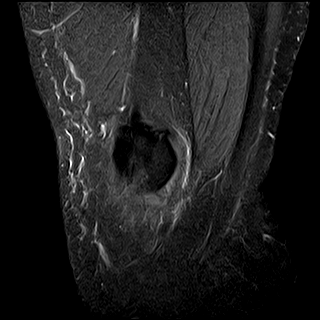
[im 13/26]
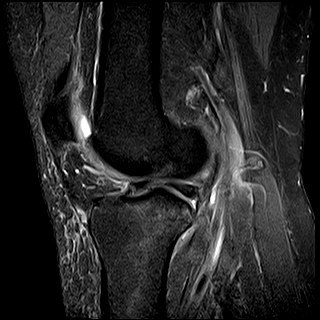
[im 17/26]
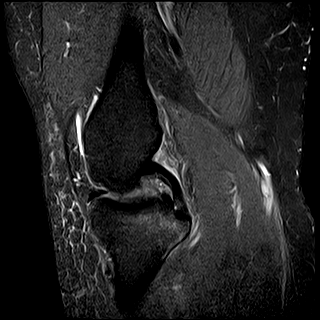
[im 21/26]
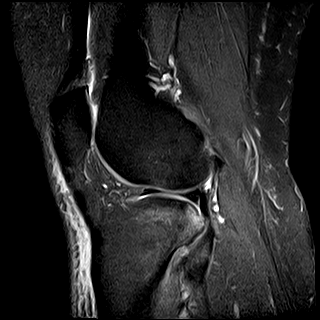
[im 26/26]
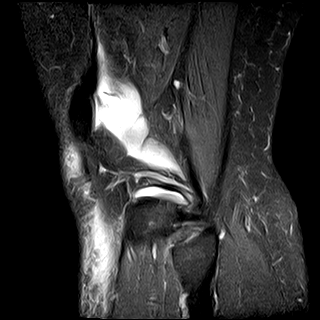

[Series 7: T1 · sagittal · left · 3.5mm · 0.53mm/px · 8 of 26 slices shown]
[im 1/26]
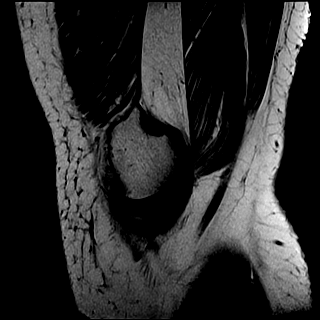
[im 4/26]
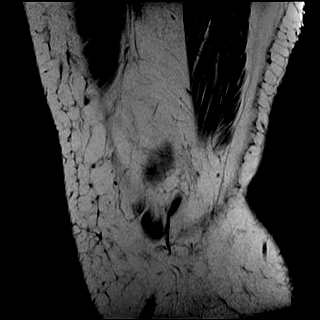
[im 8/26]
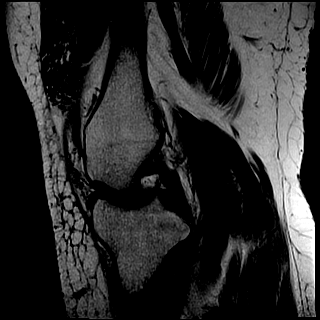
[im 11/26]
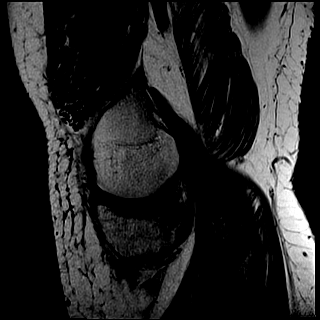
[im 15/26]
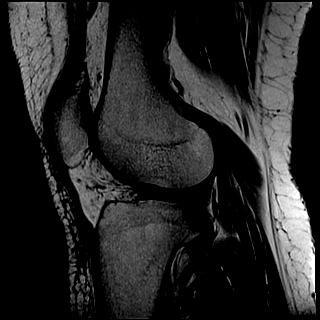
[im 18/26]
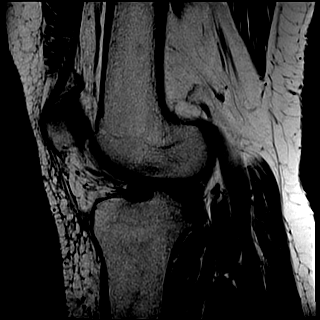
[im 22/26]
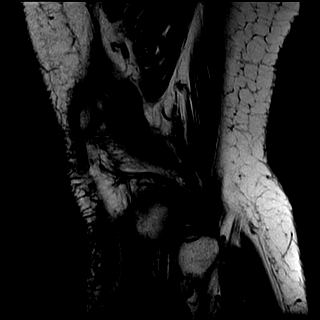
[im 26/26]
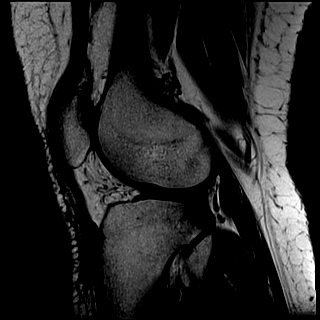

[Series 8: STIR · coronal · left · 3.5mm · 0.59mm/px · 5 of 30 slices shown]
[im 1/30]
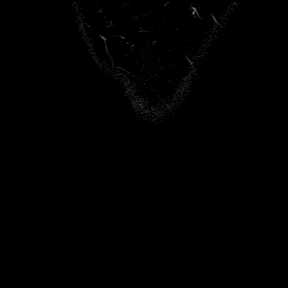
[im 4/30]
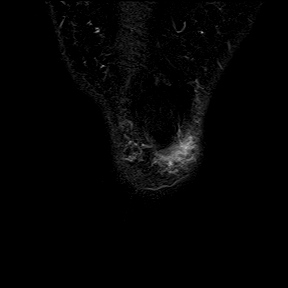
[im 8/30]
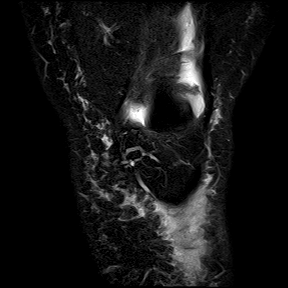
[im 11/30]
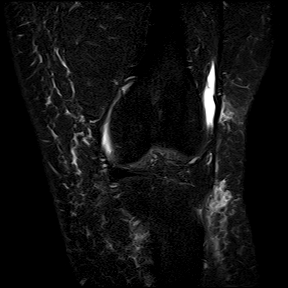
[im 19/30]
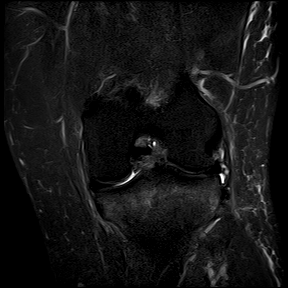

[Series 9: PD fat-sat · coronal · left · 3.5mm · 0.53mm/px · 9 of 30 slices shown (3 of 3)]
[im 1/30]
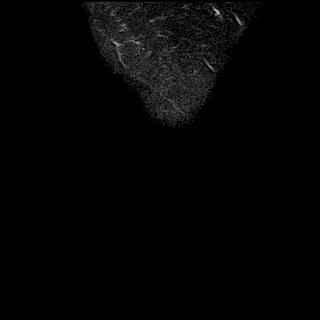
[im 4/30]
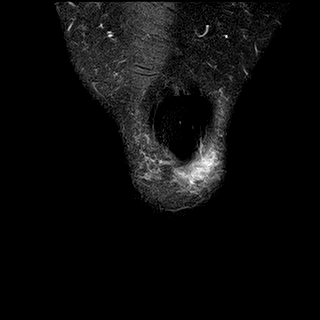
[im 8/30]
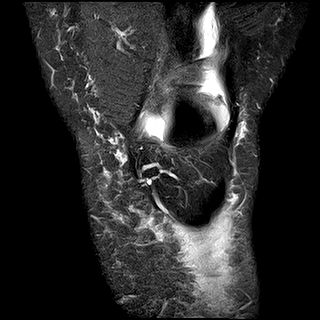
[im 11/30]
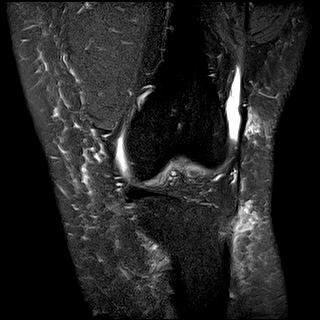
[im 15/30]
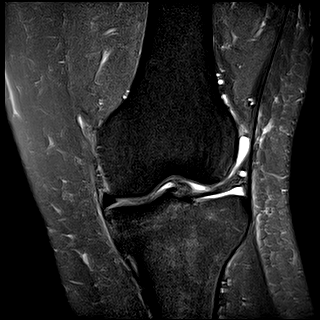
[im 19/30]
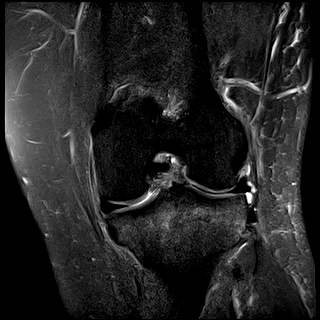
[im 22/30]
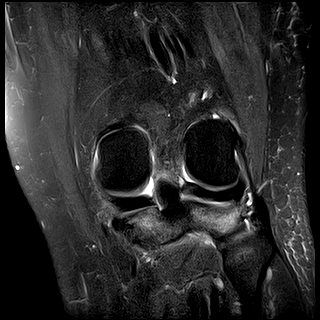
[im 26/30]
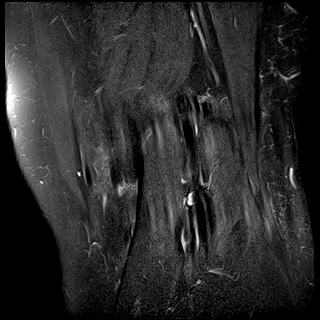
[im 30/30]
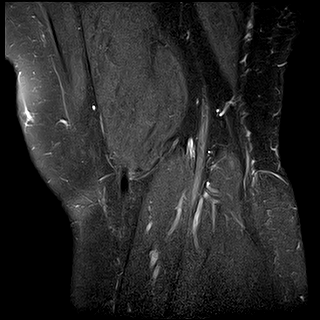

[36 of 40 positions shown; findings below may reference images not displayed]

FINDINGS: Transversely oriented bone marrow edema involving subarticular aspect of the metaphyseal region of proximal tibia is noted at the left knee.  Small area of disruption of the margin of the bony cortex of the metaphyseal region of proximal lateral tibia.

Full-thickness disruption of the proximal femoral attachment of ACL is noted with significant anterior translocation by 10 mm of the proximal tibia in relation to distal femur at the left knee.

The lateral meniscus and lateral articular cartilage do not show acute findings.  The medial meniscus shows no acute findings.  Mild bruising superficial to the medial collateral ligament is noted without disruption of the MCL.  This is suggestive of Grade 1 sprain of MCL.  Lateral ligaments are intact.

Small effusion in the knee joint.  Infrapatellar bursitis.  No osteochondral loose bodies are seen.
IMPRESSION: 1.  Subarticular bone marrow edema involving metaphyseal region of the proximal tibia with a small area of cortical disruption of the lateral tibial metaphyseal region suggestive of subacute fracture and bone marrow edema.  

2. Full-thickness disruption of the proximal femoral attachment of ACL with significant anterior translocation of proximal tibia by 10 mm in relation to distal femur.

3. Mild grade 1 sprain medial collateral ligament with edema superficial to MCL.

4. Effusion in the knee joint. Infrapatellar bursitis.

## 2023-03-02 IMAGING — MR MRI FOOT RT WO CONTRAST
4 of 6 series · 19 of 40 positions shown · IV contrast (gadolinium)
Comparison: None available.

﻿EXAM:  [id number]   MRI FOOT RT WO CONTRAST
INDICATION: 46-year-old sustained trauma due to fall from significant height 1 month ago with persistent pain in the right foot and unable to bear weight.  No history of previous surgery of the right foot.
TECHNIQUE: Multiplanar multisequential MRI of the right foot was performed without gadolinium contrast.

[Series 5: T1 · sagittal · right · 3.2mm · 0.49mm/px · 6 of 24 slices shown (1 of 3)]
[im 1/24]
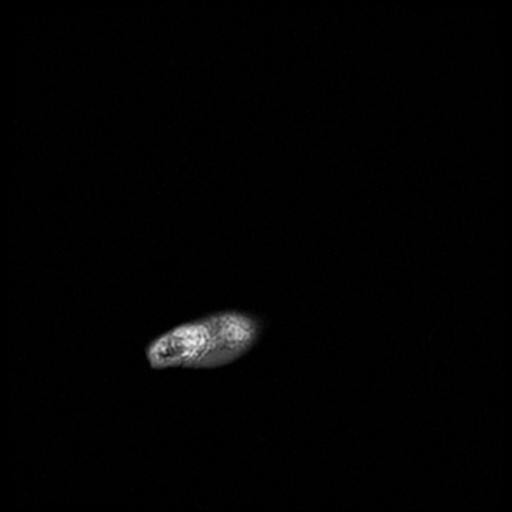
[im 5/24]
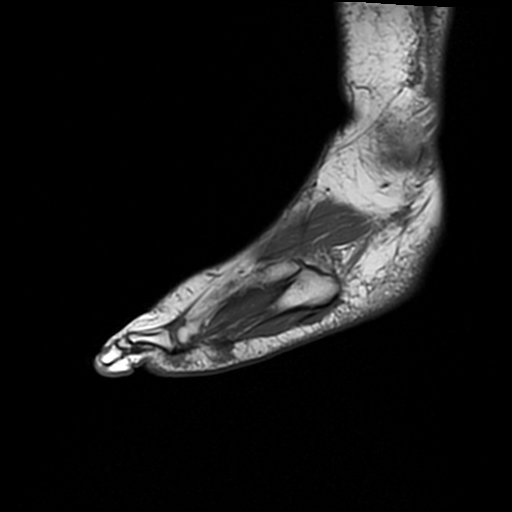
[im 10/24]
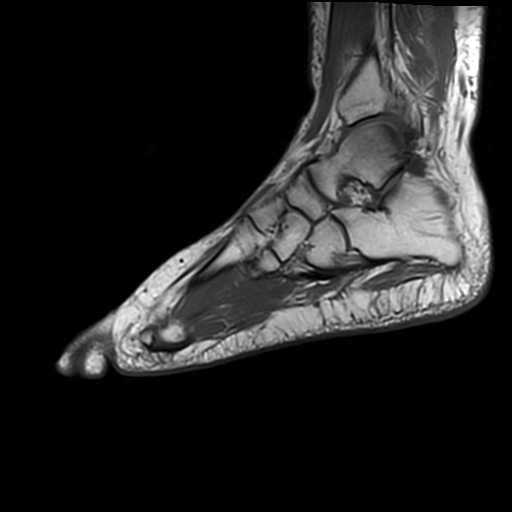
[im 14/24]
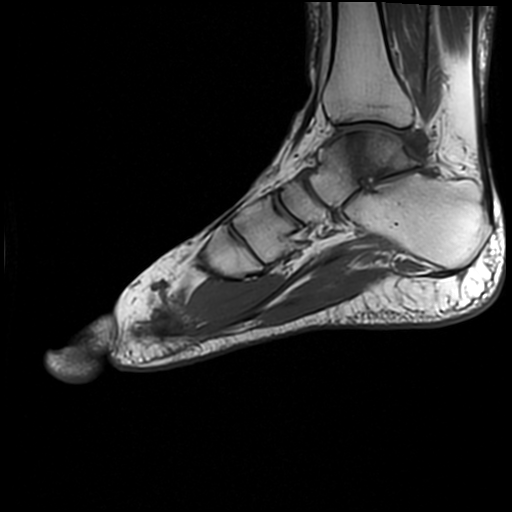
[im 19/24]
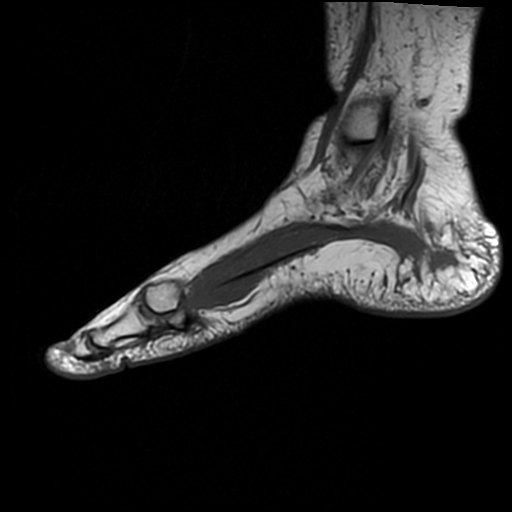
[im 24/24]
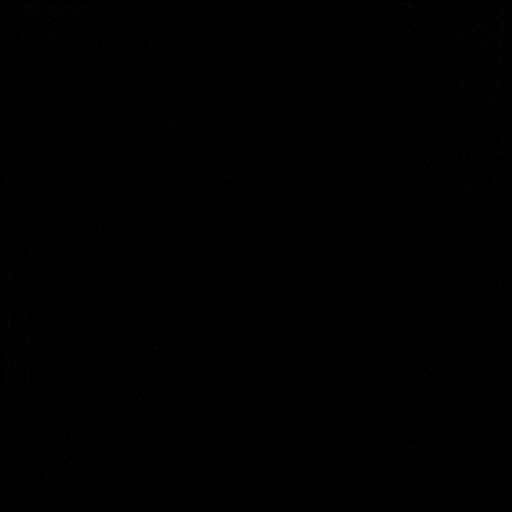

[Series 7: T1 · coronal · right · 5.2mm · 0.29mm/px · 4 of 32 slices shown (2 of 3)]
[im 1/32]
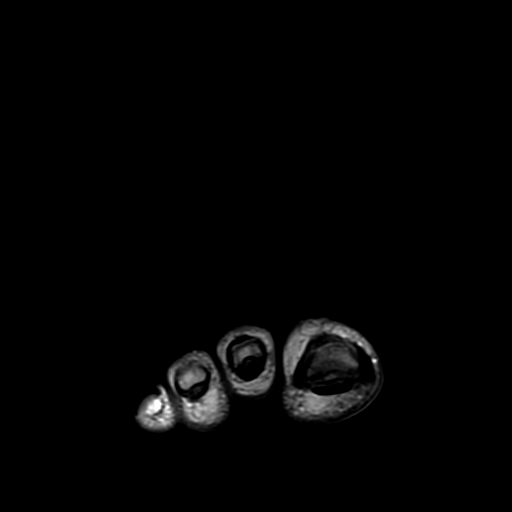
[im 5/32]
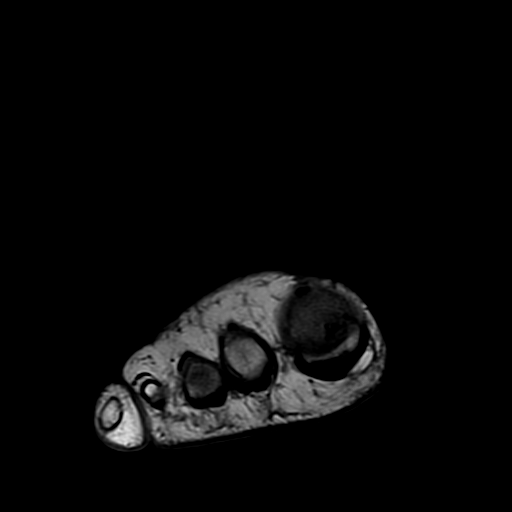
[im 18/32]
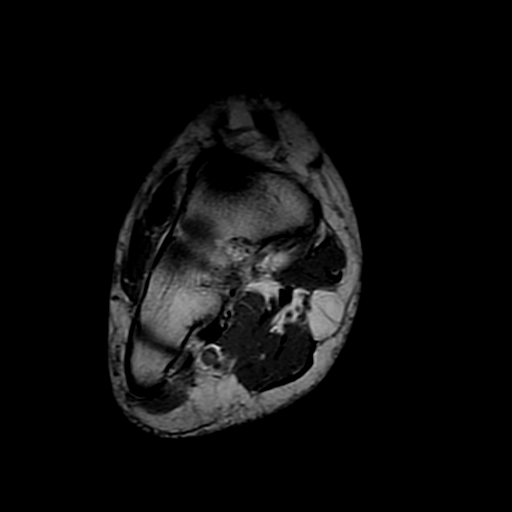
[im 27/32]
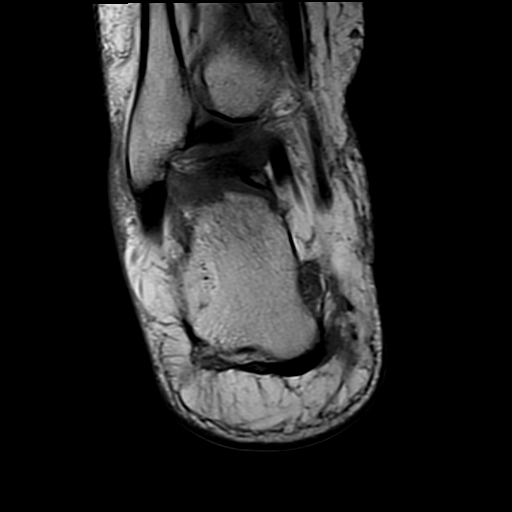

[Series 8: T1 · axial · right · 3.2mm · 0.49mm/px · z∈[-53,+29]mm · 3 of 26 slices shown (3 of 3)]
[im 6/26]
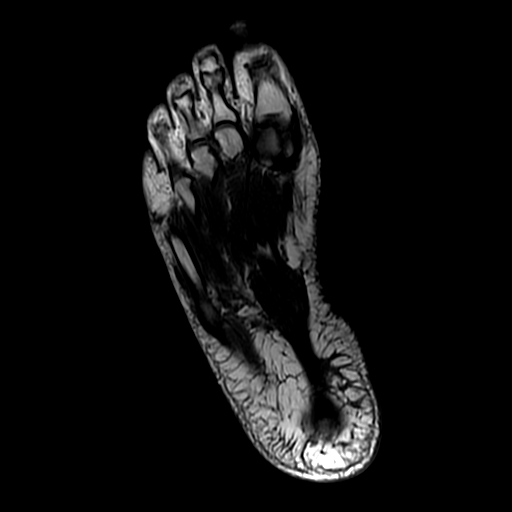
[im 16/26]
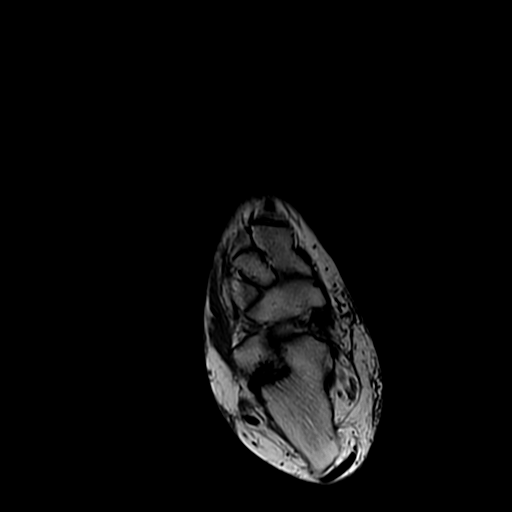
[im 26/26]
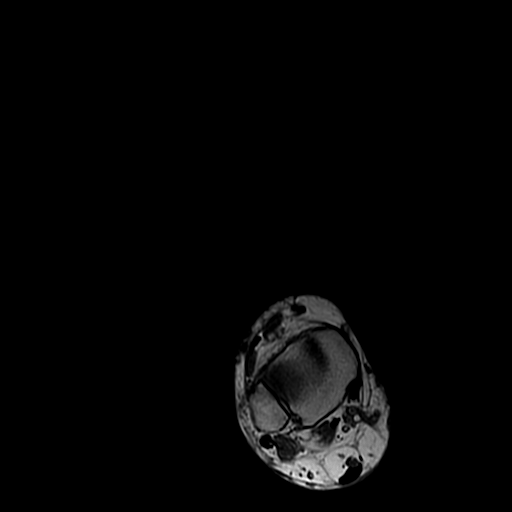

[Series 9: T2 fat-sat · axial · right · 3.2mm · 0.49mm/px · z∈[-73,+29]mm · 6 of 26 slices shown]
[im 1/26]
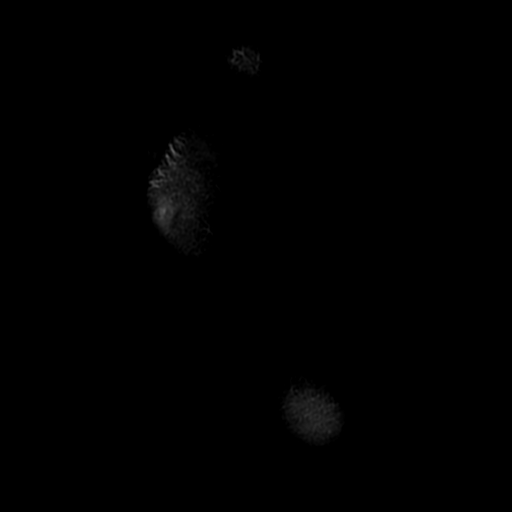
[im 6/26]
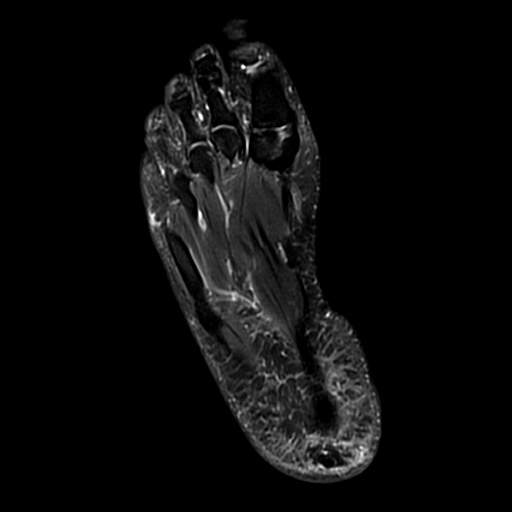
[im 11/26]
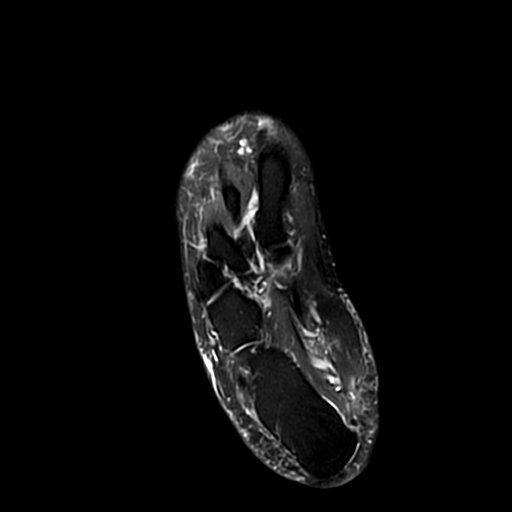
[im 16/26]
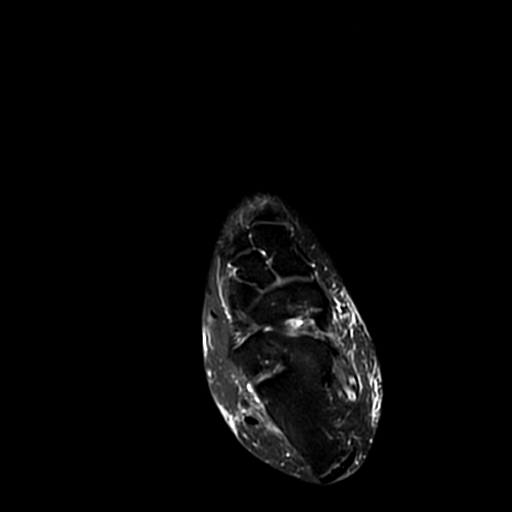
[im 21/26]
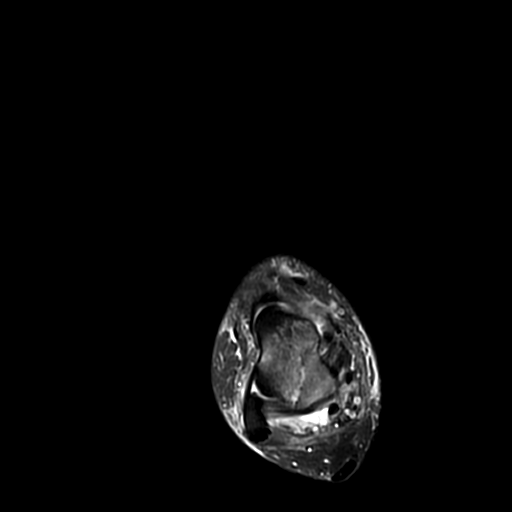
[im 26/26]
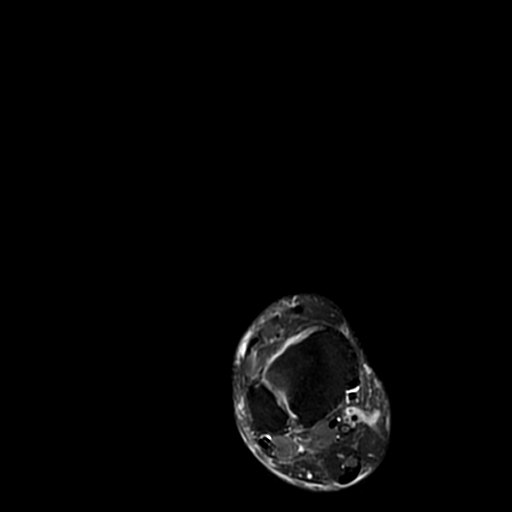

[19 of 40 positions shown; findings below may reference images not displayed]

FINDINGS: Significant bone marrow edema posterior [DATE] of the talus is noted with vertically oriented non-displaced fracture of the talus extending from the subarticular aspect of the dome of the talus to the articular surface of subtalar joint.  There is no disruption of the articular surface or the articular cartilage over the dome of the talus. 

There is no evidence of disruption of the distal tibiofibular syndesmosis.  Medial and lateral collateral ligaments are intact.

The remaining bones of the hind foot do not show any acute lesions.  Medial and lateral tendons of the ankle are intact.  Achilles tendon and extensor tendon are intact.  Small effusion in the ankle joint is noted.
IMPRESSION: 1. Vertically oriented non-displaced fracture of the talus is noted extending from the subarticular aspect of the dome of the talus to the articular surface of the subtalar joint without significant disruption of the articular surfaces on both sides. Articular cartilage over dome of talus is intact.  Significant bone marrow edema posterior [DATE] of the talus is noted.

2. Small effusion in the ankle joint. 

3. Tendons and ligaments of the right ankle are intact.

## 2023-03-20 ENCOUNTER — Other Ambulatory Visit (INDEPENDENT_AMBULATORY_CARE_PROVIDER_SITE_OTHER): Payer: Self-pay | Admitting: Physician Assistant

## 2023-04-05 ENCOUNTER — Other Ambulatory Visit (INDEPENDENT_AMBULATORY_CARE_PROVIDER_SITE_OTHER): Payer: Self-pay | Admitting: Physician Assistant

## 2023-04-08 ENCOUNTER — Other Ambulatory Visit: Payer: Self-pay

## 2023-04-08 ENCOUNTER — Encounter (INDEPENDENT_AMBULATORY_CARE_PROVIDER_SITE_OTHER): Payer: Self-pay | Admitting: Physician Assistant

## 2023-04-08 ENCOUNTER — Ambulatory Visit (INDEPENDENT_AMBULATORY_CARE_PROVIDER_SITE_OTHER): Payer: BC Managed Care – PPO | Admitting: Physician Assistant

## 2023-04-08 ENCOUNTER — Ambulatory Visit: Payer: BC Managed Care – PPO | Attending: Physician Assistant | Admitting: Physician Assistant

## 2023-04-08 VITALS — BP 128/88 | HR 89 | Ht 70.0 in | Wt 311.0 lb

## 2023-04-08 DIAGNOSIS — Z0000 Encounter for general adult medical examination without abnormal findings: Secondary | ICD-10-CM | POA: Insufficient documentation

## 2023-04-08 MED ORDER — PHENTERMINE 37.5 MG CAPSULE
37.5000 mg | ORAL_CAPSULE | Freq: Every day | ORAL | 2 refills | Status: DC
Start: 2023-04-08 — End: 2023-07-06

## 2023-04-08 NOTE — Nursing Note (Signed)
Patient is here for routine yearly exam.

## 2023-04-08 NOTE — Progress Notes (Signed)
[facility]NAL MEDICINE[facility]  [address]  [place] [address]          Follow Up        Reason for Visit: Annual Wellness Exam     History of Present Illness  Wayne Moody. is a 47 y.o. male who is being seen today in office for well exam today.  Patient states that he is overall feeling well and has a Good energy level.  Reports a well balanced diet and water intake.  He is still working on weight loss and uses Adipex daily.   Pt is not currently active however due to recent injury resulting in fracture and tendon tears of his lower extremities while at work.  States he has been on FMLA for over a month now and has follow-up with orthopedic scheduled.  At this time he has no current complaints however requests medication refill.  Refuses colonoscopy.    PHQ Questionnaire         History reviewed. No pertinent past medical history.        No past surgical history on file.     Family Medical History:       Problem Relation (Age of Onset)    Cancer Mother            Social History     Tobacco Use    Smoking status: Never    Smokeless tobacco: Never   Vaping Use    Vaping status: Never Used   Substance Use Topics    Alcohol use: Never    Drug use: Never     Medication:  montelukast (SINGULAIR) 10 mg Oral Tablet, Take 1 tablet by mouth once daily  naproxen (NAPROSYN) 500 mg Oral Tablet, Take 1 Tablet (500 mg total) by mouth Twice daily with food  phentermine (ADIPEX-P) 37.5 mg Oral Capsule, Take 1 Capsule (37.5 mg total) by mouth Once a day    No facility-administered medications prior to visit.    Allergies:  Allergies   Allergen Reactions    Iodine Itching and Swelling    Shellfish Derived Swelling       Physical Exam:  Vitals:    04/08/23 1514   BP: 128/88   Pulse: 89   SpO2: 98%   Weight: (!) 141 kg (311 lb)   Height: 1.778 m (5\' 10" )   BMI: 44.72        Vitals stable  Pt obese  General appearance: alert, cooperative, in no acute distress  HEENT: Ears: clear, no effusion, no erythema; Throat:  non-erythematous; no LAD, neck supple, moist mucus membranes  Lungs: clear to auscultation bilaterally; no wheezes or rhonchi   Heart: regular rate and rhythm; normal S1 & S2; no murmur  Abdomen: soft, non-tender, obese bowel sounds present  Extremities: extremities normal ROM, no cyanosis  , no rash  Swelling noted RT lower leg due to previous injury  No red hot inflamed joints noted  Psych: alert and oriented x 3  Neuro: CN 2-12 grossly intact    Assessment/Plan:  Problem List Items Addressed This Visit    None  Visit Diagnoses       Health maintenance examination    -  Primary    Relevant Orders    CBC/DIFF    COMPREHENSIVE METABOLIC PNL, FASTING    LIPID PANEL    PSA, DIAGNOSTIC    THYROID STIMULATING HORMONE WITH FREE T4 REFLEX  Labs ordered at [facility]  Continue diet wt loss efforts  Med refills as noted    Post-Discharge Follow Up Appointments       Wednesday Jul 14, 2023    Return Patient Visit with Eyvonne Mechanic, PA-C at  9:00 AM      Tuesday Apr 11, 2024    Return Patient Visit with Eyvonne Mechanic, PA-C at  9:30 AM      Internal Medicine, Building [facility]  [facility]  [address]  [place] [address]  [phone]              Seek medical attention for new or worsening symptoms.  Patient has been seen in this clinic within the last 3 years.     Leydi Winstead, PA-C          This note was partially created using MModal Fluency Direct system (voice recognition software) and is inherently subject to errors including those of syntax and "sound-alike" substitutions which may escape proofreading.  In such instances, original meaning may be extrapolated by contextual derivation.

## 2023-06-29 ENCOUNTER — Other Ambulatory Visit (INDEPENDENT_AMBULATORY_CARE_PROVIDER_SITE_OTHER): Payer: Self-pay | Admitting: Physician Assistant

## 2023-07-06 ENCOUNTER — Other Ambulatory Visit (INDEPENDENT_AMBULATORY_CARE_PROVIDER_SITE_OTHER): Payer: Self-pay | Admitting: Physician Assistant

## 2023-07-06 MED ORDER — PHENTERMINE 37.5 MG CAPSULE
37.5000 mg | ORAL_CAPSULE | Freq: Every day | ORAL | 0 refills | Status: DC
Start: 2023-07-06 — End: 2023-08-10

## 2023-07-14 ENCOUNTER — Ambulatory Visit: Payer: BC Managed Care – PPO | Admitting: Physician Assistant

## 2023-07-27 ENCOUNTER — Ambulatory Visit: Payer: BC Managed Care – PPO | Admitting: Physician Assistant

## 2023-08-10 ENCOUNTER — Encounter (INDEPENDENT_AMBULATORY_CARE_PROVIDER_SITE_OTHER): Payer: Self-pay | Admitting: Physician Assistant

## 2023-08-10 ENCOUNTER — Other Ambulatory Visit (INDEPENDENT_AMBULATORY_CARE_PROVIDER_SITE_OTHER): Payer: Self-pay | Admitting: Physician Assistant

## 2023-08-10 ENCOUNTER — Ambulatory Visit: Payer: BC Managed Care – PPO | Attending: Physician Assistant | Admitting: Physician Assistant

## 2023-08-10 ENCOUNTER — Other Ambulatory Visit: Payer: Self-pay

## 2023-08-10 VITALS — BP 136/88 | HR 89 | Ht 70.0 in | Wt 301.0 lb

## 2023-08-10 DIAGNOSIS — R7989 Other specified abnormal findings of blood chemistry: Secondary | ICD-10-CM | POA: Insufficient documentation

## 2023-08-10 DIAGNOSIS — R5383 Other fatigue: Secondary | ICD-10-CM | POA: Insufficient documentation

## 2023-08-10 DIAGNOSIS — E559 Vitamin D deficiency, unspecified: Secondary | ICD-10-CM | POA: Insufficient documentation

## 2023-08-10 DIAGNOSIS — Z6841 Body Mass Index (BMI) 40.0 and over, adult: Secondary | ICD-10-CM

## 2023-08-10 DIAGNOSIS — E785 Hyperlipidemia, unspecified: Secondary | ICD-10-CM | POA: Insufficient documentation

## 2023-08-10 MED ORDER — PHENTERMINE 37.5 MG CAPSULE
37.5000 mg | ORAL_CAPSULE | Freq: Every day | ORAL | 0 refills | Status: DC
Start: 2023-08-10 — End: 2023-11-26

## 2023-08-10 NOTE — Progress Notes (Signed)
INTERNAL MEDICINE, BUILDING A  [address]  [place] [address]          Follow Up        Reason for Visit: Follow Up (Routine ) needs lab orders    History of Present Illness  Wayne Moody. is a 47 y.o. male who is being seen today in office for follow-up  cholesterol/lipids with patient currently not on statin therapy.  He has had some borderline elevated levels with history of obesity.  Patient has tried several treatments for weight loss including the injectables however some side effects noted so he is currently using Adipex which seems to be working well. He admitted to putting some wt on recently after having a falling injury where his RT heel was FX as well as LT tibia. He also had a meniscal tear of left knee so was sedentary more. He is now doing well and more active so has lost 10 lbs since last visit. Currently weight is 301 lb previously 311.    Follow-up low testosterone for which patient previously on testosterone supplements.  He has currently not taking medication but would like for levels to be checked again when he does his labs. Still has some fatigue issues noted.    F/u vit d def w pt currently not taking vit d but is using a MVI w some vit d in it.    He has also had an issues w anxiety in the past for which he was given valium prn. At this time states anxiety is stable and he is doing well so not using any meds. Denies any depression issues.     Refuses flu vac    PHQ Questionnaire       History reviewed. No pertinent past medical history.        No past surgical history on file.     Family Medical History:       Problem Relation (Age of Onset)    Cancer Mother            Social History     Tobacco Use    Smoking status: Never    Smokeless tobacco: Never   Vaping Use    Vaping status: Never Used   Substance Use Topics    Alcohol use: Never    Drug use: Never     Medication:  montelukast (SINGULAIR) 10 mg Oral Tablet, Take 1 tablet by mouth once daily  phentermine  (ADIPEX-P) 37.5 mg Oral Capsule, Take 1 Capsule (37.5 mg total) by mouth Once a day    No facility-administered medications prior to visit.    Allergies:  Allergies   Allergen Reactions    Iodine Itching and Swelling    Shellfish Derived Swelling       Physical Exam:  Vitals:    08/10/23 0902   BP: 136/88   Pulse: 89   SpO2: 95%   Weight: (!) 137 kg (301 lb)   Height: 1.778 m (5\' 10" )   BMI: 43.28        Vitals reviewed  General appearance: alert, cooperative, in no acute distress  Obesity noted  HEENT: Ears: clear, no effusion, no erythema; Throat: non-erythematous; no LAD, neck supple, moist mucus membranes  Lungs: clear to auscultation bilaterally; no wheezes or rhonchi   Heart: regular rate and rhythm; normal S1 & S2; no murmur  Abdomen: soft, non-tender, obese bowel sounds present  Extremities: extremities normal ROM, no cyanosis or edema , no rash  No red hot inflamed joints noted  Gait stable  Psych: alert and oriented x 3  Neuro: CN 2-12 grossly intact    Assessment/Plan:  Problem List Items Addressed This Visit       Morbid obesity (CMS HCC)    Vitamin D deficiency    Hyperlipidemia - Primary    Low testosterone    Fatigue          Labs orders given to be obtained at lab [facility] flu vac  Adipex refilled as noted  Continue diet wt loss efforts  Follow-up pending lab results otherwise is scheduled      Post-Discharge Follow Up Appointments       Thursday Nov 11, 2023    Return Patient Visit with Eyvonne Mechanic, PA-C at  8:30 AM      Tuesday Apr 11, 2024    Return Patient Visit with Eyvonne Mechanic, PA-C at  9:30 AM      Internal Medicine, Building A  [facility]  [address]  [place] [address]  [phone]              Seek medical attention for new or worsening symptoms.  Patient has been seen in this clinic within the last 3 years.     Vinicius Brockman, PA-C          This note was partially created using MModal Fluency Direct system (voice recognition software) and is inherently subject to  errors including those of syntax and "sound-alike" substitutions which may escape proofreading.  In such instances, original meaning may be extrapolated by contextual derivation.

## 2023-08-10 NOTE — Nursing Note (Signed)
Patient is here for routine follow up for medication refills.

## 2023-08-17 LAB — COMPREHENSIVE METABOLIC PNL, FASTING: ESTIMATED GLOMERULAR FILTRATION RATE: 71

## 2023-08-18 ENCOUNTER — Other Ambulatory Visit (INDEPENDENT_AMBULATORY_CARE_PROVIDER_SITE_OTHER): Payer: Self-pay

## 2023-08-18 DIAGNOSIS — Z0000 Encounter for general adult medical examination without abnormal findings: Secondary | ICD-10-CM

## 2023-08-18 DIAGNOSIS — R7989 Other specified abnormal findings of blood chemistry: Secondary | ICD-10-CM

## 2023-08-23 ENCOUNTER — Other Ambulatory Visit (INDEPENDENT_AMBULATORY_CARE_PROVIDER_SITE_OTHER): Payer: Self-pay | Admitting: Physician Assistant

## 2023-08-23 DIAGNOSIS — R7989 Other specified abnormal findings of blood chemistry: Secondary | ICD-10-CM

## 2023-08-31 ENCOUNTER — Other Ambulatory Visit (INDEPENDENT_AMBULATORY_CARE_PROVIDER_SITE_OTHER): Payer: Self-pay

## 2023-08-31 DIAGNOSIS — R7989 Other specified abnormal findings of blood chemistry: Secondary | ICD-10-CM

## 2023-09-01 ENCOUNTER — Other Ambulatory Visit (INDEPENDENT_AMBULATORY_CARE_PROVIDER_SITE_OTHER): Payer: Self-pay | Admitting: Physician Assistant

## 2023-09-01 MED ORDER — TESTOSTERONE CYPIONATE 200 MG/ML INTRAMUSCULAR OIL
TOPICAL_OIL | INTRAMUSCULAR | 0 refills | Status: DC
Start: 2023-09-01 — End: 2024-06-20

## 2023-09-29 ENCOUNTER — Other Ambulatory Visit (INDEPENDENT_AMBULATORY_CARE_PROVIDER_SITE_OTHER): Payer: Self-pay | Admitting: Physician Assistant

## 2023-11-10 ENCOUNTER — Ambulatory Visit (INDEPENDENT_AMBULATORY_CARE_PROVIDER_SITE_OTHER): Payer: Self-pay | Admitting: Physician Assistant

## 2023-11-11 ENCOUNTER — Ambulatory Visit: Payer: BC Managed Care – PPO | Admitting: Physician Assistant

## 2023-11-26 ENCOUNTER — Other Ambulatory Visit: Payer: Self-pay

## 2023-11-26 ENCOUNTER — Encounter (INDEPENDENT_AMBULATORY_CARE_PROVIDER_SITE_OTHER): Payer: Self-pay | Admitting: Physician Assistant

## 2023-11-26 ENCOUNTER — Ambulatory Visit: Payer: 59 | Attending: Physician Assistant | Admitting: Physician Assistant

## 2023-11-26 VITALS — BP 126/82 | HR 75 | Ht 70.0 in | Wt 316.0 lb

## 2023-11-26 DIAGNOSIS — E785 Hyperlipidemia, unspecified: Secondary | ICD-10-CM | POA: Insufficient documentation

## 2023-11-26 DIAGNOSIS — R7989 Other specified abnormal findings of blood chemistry: Secondary | ICD-10-CM | POA: Insufficient documentation

## 2023-11-26 DIAGNOSIS — E66813 Obesity, class 3: Secondary | ICD-10-CM

## 2023-11-26 DIAGNOSIS — Z6841 Body Mass Index (BMI) 40.0 and over, adult: Secondary | ICD-10-CM

## 2023-11-26 MED ORDER — PHENTERMINE 37.5 MG CAPSULE
37.5000 mg | ORAL_CAPSULE | Freq: Every day | ORAL | 0 refills | Status: DC
Start: 2023-11-26 — End: 2024-06-20

## 2023-11-26 MED ORDER — MONTELUKAST 10 MG TABLET
10.0000 mg | ORAL_TABLET | Freq: Every day | ORAL | 1 refills | Status: DC
Start: 2023-11-26 — End: 2024-09-11

## 2023-11-26 NOTE — Nursing Note (Signed)
Patient is in the office for routine 3 month follow up for medication refills.

## 2023-11-26 NOTE — Progress Notes (Signed)
[facility]ICINE[facility]  [address]  [place] [address]          Follow Up        Reason for Visit: Follow Up 3 Months (Routine ) needs lab orders    History of Present Illness  Wayne Moody. is a 48 y.o. male who is being seen today in office for follow-up  concerning cholesterol/lipids with patient currently not on statin therapy.  He has had some borderline elevated levels with history of obesity.  Patient has tried several treatments for weight loss including the injectables however some side effects noted so he is currently using Adipex. He once again have put  some wt on recently after having a falling injury where his RT heel was FX as well as LT tibia. He also had a meniscal tear of left knee so was sedentary more. He is now doing well and back to work.  His weight is up a little bit today from previous at 316 lb but he has his work boots on along with heavy jacket and jeans.  He states at home his weight was closer to the previous weight of 301 and he is maintaining around there.  No side effects or issues with Adipex noted and requests refills.    Follow-up low testosterone for which patient is now back to taking testosterone injections.  He is currently at every 2 week cycle and states it testosterone helped his energy level significantly.  He has been side effects or issues with it but does need follow-up labs and would like an order to take to [facility].    Refuses flu vac    PHQ Questionnaire       History reviewed. No pertinent past medical history.        No past surgical history on file.     Family Medical History:       Problem Relation (Age of Onset)    Cancer Mother            Social History     Tobacco Use    Smoking status: Never    Smokeless tobacco: Never   Vaping Use    Vaping status: Never Used   Substance Use Topics    Alcohol use: Never    Drug use: Never     Medication:  testosterone cypionate (DEPO-TESTOTERONE) 200 mg/mL IntraMUSCULAR Oil, 1 cc IM weekly x 4  weeks then q 2 weeks as directed for low testosterone  montelukast (SINGULAIR) 10 mg Oral Tablet, Take 1 tablet by mouth once daily  phentermine (ADIPEX-P) 37.5 mg Oral Capsule, Take 1 Capsule (37.5 mg total) by mouth Once a day    No facility-administered medications prior to visit.    Allergies:  Allergies   Allergen Reactions    Iodine Itching and Swelling    Shellfish Derived Swelling       Physical Exam:  Vitals:    11/26/23 0849   BP: 126/82   Pulse: 75   SpO2: 98%   Weight: (!) 143 kg (316 lb)   Height: 1.778 m (5\' 10" )   BMI: 45.34          Vitals reviewed  General appearance: alert, cooperative, in no acute distress  Obesity noted BMI currently 45.34  HEENT: Ears: clear, no effusion, no erythema; Throat: non-erythematous; no LAD, neck supple, moist mucus membranes  Lungs: clear to auscultation bilaterally; no wheezes or rhonchi   Heart: regular rate and rhythm; normal S1 &  S2; no murmur  Abdomen: soft, non-tender obese bowel sounds present  Extremities: extremities normal ROM, no cyanosis or edema , no rash  No red hot inflamed joints noted  Gait stable  Psych: alert and oriented x 3  Neuro: CN 2-12 grossly intact    Assessment/Plan:  Problem List Items Addressed This Visit       Morbid obesity (CMS HCC)    Relevant Orders    CBC/DIFF    ESTRADIOL    COMPREHENSIVE METABOLIC PNL, FASTING    TESTOSTERONE FREE (DIALYSIS) AND TOTAL,MS    Hyperlipidemia - Primary    Relevant Orders    CBC/DIFF    ESTRADIOL    COMPREHENSIVE METABOLIC PNL, FASTING    TESTOSTERONE FREE (DIALYSIS) AND TOTAL,MS    Low testosterone    Relevant Orders    CBC/DIFF    ESTRADIOL    COMPREHENSIVE METABOLIC PNL, FASTING    TESTOSTERONE FREE (DIALYSIS) AND TOTAL,MS            Labs orders given to be obtained at lab [facility] flu vac  Adipex refilled as noted  Continue diet wt loss efforts  Follow-up pending lab results otherwise is scheduled      Post-Discharge Follow Up Appointments       Thursday Feb 17, 2024    Return Patient Visit  with Eyvonne Mechanic, PA-C at  8:30 AM      Tuesday Apr 11, 2024    Return Patient Visit with Eyvonne Mechanic, PA-C at  9:30 AM      Internal Medicine, Building A  [facility]  [address]  [place] [address]  [phone]                 Seek medical attention for new or worsening symptoms.  Patient has been seen in this clinic within the last 3 years.     Carmel Garfield, PA-C          This note was partially created using MModal Fluency Direct system (voice recognition software) and is inherently subject to errors including those of syntax and "sound-alike" substitutions which may escape proofreading.  In such instances, original meaning may be extrapolated by contextual derivation.

## 2024-02-17 ENCOUNTER — Ambulatory Visit (INDEPENDENT_AMBULATORY_CARE_PROVIDER_SITE_OTHER): Payer: Self-pay | Admitting: Physician Assistant

## 2024-04-11 ENCOUNTER — Ambulatory Visit (INDEPENDENT_AMBULATORY_CARE_PROVIDER_SITE_OTHER): Payer: Self-pay | Admitting: Physician Assistant

## 2024-05-22 ENCOUNTER — Other Ambulatory Visit (INDEPENDENT_AMBULATORY_CARE_PROVIDER_SITE_OTHER): Payer: Self-pay | Admitting: Physician Assistant

## 2024-06-20 ENCOUNTER — Encounter (INDEPENDENT_AMBULATORY_CARE_PROVIDER_SITE_OTHER): Payer: Self-pay | Admitting: Physician Assistant

## 2024-06-20 ENCOUNTER — Other Ambulatory Visit: Payer: Self-pay

## 2024-06-20 ENCOUNTER — Ambulatory Visit: Payer: Self-pay | Attending: Physician Assistant | Admitting: Physician Assistant

## 2024-06-20 VITALS — BP 116/84 | HR 78 | Ht 70.0 in | Wt 318.0 lb

## 2024-06-20 DIAGNOSIS — R7989 Other specified abnormal findings of blood chemistry: Secondary | ICD-10-CM | POA: Insufficient documentation

## 2024-06-20 DIAGNOSIS — E785 Hyperlipidemia, unspecified: Secondary | ICD-10-CM | POA: Insufficient documentation

## 2024-06-20 DIAGNOSIS — R635 Abnormal weight gain: Secondary | ICD-10-CM | POA: Insufficient documentation

## 2024-06-20 DIAGNOSIS — R5383 Other fatigue: Secondary | ICD-10-CM | POA: Insufficient documentation

## 2024-06-20 MED ORDER — WEGOVY 0.25 MG/0.5 ML SUBCUTANEOUS PEN INJECTOR
0.2500 mg | PEN_INJECTOR | SUBCUTANEOUS | 2 refills | Status: DC
Start: 2024-06-20 — End: 2024-09-11

## 2024-06-20 MED ORDER — ONDANSETRON 4 MG DISINTEGRATING TABLET
4.0000 mg | ORAL_TABLET | Freq: Three times a day (TID) | ORAL | 2 refills | Status: AC | PRN
Start: 2024-06-20 — End: 2024-06-30

## 2024-06-20 NOTE — Assessment & Plan Note (Signed)
-  continue weight loss efforts  -continue diet  -will assess lipid levels with lab in nov

## 2024-06-20 NOTE — Assessment & Plan Note (Signed)
 Testicular hypofunction (low testosterone )  Not treated currently; patient prefers to wait for next well visit to assess levels.  - Assess testosterone  levels during next well visit in November.

## 2024-06-20 NOTE — Nursing Note (Signed)
 Patient presents today as a routine 3 month follow up. Patient requesting to retry Wegovy  and requesting a prescription of Zofran .

## 2024-06-20 NOTE — Assessment & Plan Note (Signed)
 Morbid obesity  - Prescribe Wegovy  starting at 0.25 mg, titrate up every four weeks.  - Prescribe Zofran  for nausea management.  - Send prescriptions to [facility]

## 2024-06-20 NOTE — Assessment & Plan Note (Signed)
-  fatigue related to low testosterone  which pt is currently not taking  -will wait to assess test levels again when pt is due for labs in nov

## 2024-06-20 NOTE — Progress Notes (Signed)
 INTERNAL MEDICINE, BUILDING A  [address]  [place] [address]          Follow Up        Reason for Visit: Follow Up 3 Months (Routine ) needs labs; weight gain    This note was created with assistance from [employer] via capture of conversational audio.  Consent was obtained from the patient prior to recording.      History of Present Illness  Wayne Moody. is a 48 year old male who presents for a routine follow-up and lab work.    He reports weight gain w wt currently up to 318lb. Pt had been using adipex and prior to it Wegovy  which helped better then the adipex. Pt requesting RX again of wegovy  since med approved now on insurance. Pt states med before made him a little sick so would like Zofran  to take w it.     He continues to have issues w fatigue and was previously on testosterone  which helped but currently not taking med having discontinued it after a previous course of treatment. He also missed previous lab tests due to misplaced paperwork. His prostate level check is due in October.    F/u concerning cholesterol/lipids with patient currently not on statin therapy.  He has had some borderline elevated levels with history of obesity.  He has not done his lab since oct and would like to wait to check it again when he does his well check.        PHQ Questionnaire  Little interest or pleasure in doing things.: Not at all  Feeling down, depressed, or hopeless: Not at all  PHQ 2 Total: 0    History reviewed. No pertinent past medical history.        No past surgical history on file.     Family Medical History:       Problem Relation (Age of Onset)    Cancer Mother            Social History     Tobacco Use    Smoking status: Never    Smokeless tobacco: Never   Vaping Use    Vaping status: Never Used   Substance Use Topics    Alcohol use: Never    Drug use: Never     Medication:  montelukast  (SINGULAIR ) 10 mg Oral Tablet, Take 1 Tablet (10 mg total) by mouth Once a day  phentermine  (ADIPEX-P )  37.5 mg Oral Capsule, Take 1 Capsule (37.5 mg total) by mouth Once a day  testosterone  cypionate (DEPO-TESTOTERONE) 200 mg/mL IntraMUSCULAR Oil, 1 cc IM weekly x 4 weeks then q 2 weeks as directed for low testosterone     No facility-administered medications prior to visit.    Allergies:  Allergies   Allergen Reactions    Iodine Itching and Swelling    Shellfish Derived Swelling       Physical Exam:  Vitals:    06/20/24 0844   BP: 116/84   Pulse: 78   SpO2: 96%   Weight: (!) 144 kg (318 lb)   Height: 1.778 m (5' 10)   BMI: 45.63          Vitals reviewed  General appearance: alert, cooperative, in no acute distress  Obesity noted BMI currently 45.63  HEENT: Ears: clear, no effusion, no erythema; Throat: non-erythematous; no LAD, neck supple, moist mucus membranes  Lungs: clear to auscultation bilaterally; no wheezes or rhonchi   Heart: regular rate and rhythm; normal S1 &  S2; no murmur  Abdomen: soft, non-tender obese bowel sounds present  Extremities: extremities normal ROM, no cyanosis or edema , no rash  No red hot inflamed joints noted  Gait stable  Psych: alert and oriented x 3  Neuro: CN 2-12 grossly intact  Assessment & Plan  Weight gain  Morbid obesity  - Prescribe Wegovy  starting at 0.25 mg, titrate up every four weeks.  - Prescribe Zofran  for nausea management.  - Send prescriptions to [facility].  Fatigue, unspecified type  -fatigue related to low testosterone  which pt is currently not taking  -will wait to assess test levels again when pt is due for labs in nov  Low testosterone   Testicular hypofunction (low testosterone )  Not treated currently; patient prefers to wait for next well visit to assess levels.  - Assess testosterone  levels during next well visit in November.  Hyperlipidemia, unspecified hyperlipidemia type  -continue weight loss efforts  -continue diet  -will assess lipid levels with lab in nov     Orders Placed This Encounter    semaglutide  (WEGOVY ) 0.25 mg/0.5 mL Subcutaneous Pen Injector     ondansetron  (ZOFRAN  ODT) 4 mg Oral Tablet, Rapid Dissolve      Post-Discharge Follow Up Appointments       Monday Sep 11, 2024    Return Patient Visit with Scharlene No, PA-C at  9:15 AM      Internal Medicine, Building A  [facility]  [address]  [place] [address]  [phone]                 Seek medical attention for new or worsening symptoms.  Patient has been seen in this clinic within the last 3 years.     Fadel Clason, PA-C          This note was partially created using MModal Fluency Direct system (voice recognition software) and is inherently subject to errors including those of syntax and sound-alike substitutions which may escape proofreading.  In such instances, original meaning may be extrapolated by contextual derivation.

## 2024-09-11 ENCOUNTER — Other Ambulatory Visit (INDEPENDENT_AMBULATORY_CARE_PROVIDER_SITE_OTHER): Payer: Self-pay | Admitting: Physician Assistant

## 2024-09-11 ENCOUNTER — Encounter (INDEPENDENT_AMBULATORY_CARE_PROVIDER_SITE_OTHER): Payer: Self-pay | Admitting: Physician Assistant

## 2024-09-11 ENCOUNTER — Ambulatory Visit: Payer: Self-pay | Attending: Physician Assistant | Admitting: Physician Assistant

## 2024-09-11 ENCOUNTER — Other Ambulatory Visit: Payer: Self-pay

## 2024-09-11 VITALS — BP 116/82 | HR 78 | Ht 70.0 in | Wt 314.0 lb

## 2024-09-11 DIAGNOSIS — Z0000 Encounter for general adult medical examination without abnormal findings: Secondary | ICD-10-CM | POA: Insufficient documentation

## 2024-09-11 MED ORDER — MONTELUKAST 10 MG TABLET
10.0000 mg | ORAL_TABLET | Freq: Every day | ORAL | 1 refills | Status: AC
Start: 2024-09-11 — End: ?

## 2024-09-11 MED ORDER — ONDANSETRON 4 MG DISINTEGRATING TABLET
4.0000 mg | ORAL_TABLET | Freq: Three times a day (TID) | ORAL | 1 refills | Status: AC | PRN
Start: 2024-09-11 — End: ?

## 2024-09-11 MED ORDER — WEGOVY 0.5 MG/0.5 ML SUBCUTANEOUS PEN INJECTOR
0.5000 mg | PEN_INJECTOR | SUBCUTANEOUS | 3 refills | Status: AC
Start: 2024-09-11 — End: 2024-10-11

## 2024-09-11 NOTE — Progress Notes (Signed)
 INTERNAL MEDICINE, BUILDING A  [address]  [place] [address]       Well Exam        Reason for Visit: Annual Wellness Exam (Routine )     History of Present Illness  Wayne Moody. is a 48 y.o. male who is being seen today in office for well exam. Patient states that he is overall feeling well and has a Good energy level.  Reports a well balanced diet and water intake.  He is still working on weight loss and uses wegovy  injections weekly which have helped curb his appetite.  Pt does not exercise but stays active with his job. At this time he has no current complaints or new issues noted.  Refuses colonoscopy.    PHQ Questionnaire       History reviewed. No pertinent past medical history.      No past surgical history on file.     Family Medical History:       Problem Relation (Age of Onset)    Cancer Mother            Social History     Tobacco Use    Smoking status: Never    Smokeless tobacco: Never   Vaping Use    Vaping status: Never Used   Substance Use Topics    Alcohol use: Never    Drug use: Never     Medication:  montelukast  (SINGULAIR ) 10 mg Oral Tablet, Take 1 Tablet (10 mg total) by mouth Once a day  ondansetron  (ZOFRAN  ODT) 4 mg Oral Tablet, Rapid Dissolve, Take 1 Tablet (4 mg total) by mouth Every 8 hours as needed  semaglutide  (WEGOVY ) 0.25 mg/0.5 mL Subcutaneous Pen Injector, Inject 0.5 mL (0.25 mg total) under the skin Every 7 days for 84 days    No facility-administered medications prior to visit.    Allergies:  Allergies   Allergen Reactions    Iodine Itching and Swelling    Shellfish Derived Swelling       Physical Exam:  Vitals:    09/11/24 0926   BP: 116/82   Pulse: 78   SpO2: 96%   Weight: (!) 142 kg (314 lb)   Height: 1.778 m (5' 10)   BMI: 45.05          Vitals stable  Pt obese  General appearance: alert, cooperative, in no acute distress  HEENT: Ears: clear, no effusion, no erythema; Throat: non-erythematous; no LAD, neck supple, moist mucus membranes  Lungs: clear to  auscultation bilaterally; no wheezes or rhonchi   Heart: regular rate and rhythm; normal S1 & S2; no murmur  Abdomen: soft, non-tender, obese bowel sounds present  Extremities: extremities normal ROM, no cyanosis  , no rash  No red hot inflamed joints noted  Psych: alert and oriented x 3  Neuro: CN 2-12 grossly intact    Assessment & Plan  Health maintenance examination  Pt here for his yearly exam and labs. No new issues or concerns noted.     Orders Placed This Encounter    CANCELED: CBC/DIFF    CANCELED: COMPREHENSIVE METABOLIC PNL, FASTING    CANCELED: URINALYSIS, MACROSCOPIC AND MICROSCOPIC W/CULTURE REFLEX    CANCELED: LIPID PANEL    CANCELED: THYROID  STIMULATING HORMONE WITH FREE T4 REFLEX    CANCELED: PSA, DIAGNOSTIC    CBC/DIFF    COMPREHENSIVE METABOLIC PNL, FASTING    LIPID PANEL    PSA, DIAGNOSTIC    THYROID  STIMULATING HORMONE WITH  FREE T4 REFLEX    URINALYSIS, MACROSCOPIC AND MICROSCOPIC W/CULTURE REFLEX      Post-Discharge Follow Up Appointments       Tuesday Dec 12, 2024    Return Patient Visit with Scharlene No, PA-C at  8:30 AM      Monday Sep 17, 2025    Return Patient Visit with Scharlene No, PA-C at  8:30 AM      Internal Medicine, Building A  [facility]  [address]  [place] [address]  [phone]            Seek medical attention for new or worsening symptoms.  Patient has been seen in this clinic within the last 3 years.     Wayne Hubbard, PA-C          This note was partially created using MModal Fluency Direct system (voice recognition software) and is inherently subject to errors including those of syntax and sound-alike substitutions which may escape proofreading.  In such instances, original meaning may be extrapolated by contextual derivation.

## 2024-09-11 NOTE — Nursing Note (Signed)
 Patient presents today for routine annual exam.

## 2024-09-13 ENCOUNTER — Other Ambulatory Visit (INDEPENDENT_AMBULATORY_CARE_PROVIDER_SITE_OTHER): Payer: Self-pay

## 2024-09-13 DIAGNOSIS — E785 Hyperlipidemia, unspecified: Secondary | ICD-10-CM

## 2024-09-13 DIAGNOSIS — R7989 Other specified abnormal findings of blood chemistry: Secondary | ICD-10-CM

## 2024-09-13 DIAGNOSIS — Z0000 Encounter for general adult medical examination without abnormal findings: Secondary | ICD-10-CM

## 2024-09-13 LAB — COMPREHENSIVE METABOLIC PNL, FASTING: ESTIMATED GLOMERULAR FILTRATION RATE: 78

## 2024-09-18 ENCOUNTER — Ambulatory Visit (INDEPENDENT_AMBULATORY_CARE_PROVIDER_SITE_OTHER): Payer: Self-pay

## 2024-09-19 ENCOUNTER — Other Ambulatory Visit (INDEPENDENT_AMBULATORY_CARE_PROVIDER_SITE_OTHER): Payer: Self-pay | Admitting: Physician Assistant

## 2024-12-12 ENCOUNTER — Ambulatory Visit (INDEPENDENT_AMBULATORY_CARE_PROVIDER_SITE_OTHER): Payer: Self-pay | Admitting: Physician Assistant

## 2025-09-17 ENCOUNTER — Ambulatory Visit (INDEPENDENT_AMBULATORY_CARE_PROVIDER_SITE_OTHER): Payer: Self-pay | Admitting: Physician Assistant
